# Patient Record
Sex: Female | Born: 1940
Health system: Southern US, Community
[De-identification: ages and names within clinical notes are randomized; demographics above are authoritative.]

## PROBLEM LIST (undated history)

## (undated) DIAGNOSIS — K219 Gastro-esophageal reflux disease without esophagitis: Secondary | ICD-10-CM

## (undated) DIAGNOSIS — G4730 Sleep apnea, unspecified: Secondary | ICD-10-CM

## (undated) DIAGNOSIS — F419 Anxiety disorder, unspecified: Secondary | ICD-10-CM

## (undated) DIAGNOSIS — F32A Depression, unspecified: Secondary | ICD-10-CM

---

## 1997-06-03 ENCOUNTER — Ambulatory Visit (HOSPITAL_COMMUNITY): Admission: RE | Admit: 1997-06-03 | Discharge: 1997-06-03 | Payer: Self-pay | Admitting: Obstetrics and Gynecology

## 1998-07-27 ENCOUNTER — Other Ambulatory Visit: Admission: RE | Admit: 1998-07-27 | Discharge: 1998-07-27 | Payer: Self-pay | Admitting: Obstetrics and Gynecology

## 1999-10-05 ENCOUNTER — Other Ambulatory Visit: Admission: RE | Admit: 1999-10-05 | Discharge: 1999-10-05 | Payer: Self-pay | Admitting: Obstetrics and Gynecology

## 2000-12-18 ENCOUNTER — Other Ambulatory Visit: Admission: RE | Admit: 2000-12-18 | Discharge: 2000-12-18 | Payer: Self-pay | Admitting: Obstetrics and Gynecology

## 2002-05-28 ENCOUNTER — Other Ambulatory Visit: Admission: RE | Admit: 2002-05-28 | Discharge: 2002-05-28 | Payer: Self-pay | Admitting: Obstetrics and Gynecology

## 2003-07-08 ENCOUNTER — Other Ambulatory Visit: Admission: RE | Admit: 2003-07-08 | Discharge: 2003-07-08 | Payer: Self-pay | Admitting: Obstetrics and Gynecology

## 2004-08-03 ENCOUNTER — Other Ambulatory Visit: Admission: RE | Admit: 2004-08-03 | Discharge: 2004-08-03 | Payer: Self-pay | Admitting: Obstetrics and Gynecology

## 2008-06-12 ENCOUNTER — Ambulatory Visit (HOSPITAL_COMMUNITY): Admission: RE | Admit: 2008-06-12 | Discharge: 2008-06-12 | Payer: Self-pay | Admitting: Geriatric Medicine

## 2010-02-07 ENCOUNTER — Encounter: Payer: Self-pay | Admitting: Geriatric Medicine

## 2011-01-18 DIAGNOSIS — F4323 Adjustment disorder with mixed anxiety and depressed mood: Secondary | ICD-10-CM | POA: Diagnosis not present

## 2011-02-12 ENCOUNTER — Ambulatory Visit (INDEPENDENT_AMBULATORY_CARE_PROVIDER_SITE_OTHER): Payer: Medicare Other

## 2011-02-12 DIAGNOSIS — J019 Acute sinusitis, unspecified: Secondary | ICD-10-CM | POA: Diagnosis not present

## 2011-02-12 DIAGNOSIS — Z23 Encounter for immunization: Secondary | ICD-10-CM | POA: Diagnosis not present

## 2011-05-08 DIAGNOSIS — Z0000 Encounter for general adult medical examination without abnormal findings: Secondary | ICD-10-CM | POA: Diagnosis not present

## 2011-05-08 DIAGNOSIS — Z1331 Encounter for screening for depression: Secondary | ICD-10-CM | POA: Diagnosis not present

## 2011-05-08 DIAGNOSIS — E7800 Pure hypercholesterolemia, unspecified: Secondary | ICD-10-CM | POA: Diagnosis not present

## 2011-05-08 DIAGNOSIS — H9190 Unspecified hearing loss, unspecified ear: Secondary | ICD-10-CM | POA: Diagnosis not present

## 2011-05-08 DIAGNOSIS — Z79899 Other long term (current) drug therapy: Secondary | ICD-10-CM | POA: Diagnosis not present

## 2011-06-01 DIAGNOSIS — H903 Sensorineural hearing loss, bilateral: Secondary | ICD-10-CM | POA: Diagnosis not present

## 2011-06-01 DIAGNOSIS — H905 Unspecified sensorineural hearing loss: Secondary | ICD-10-CM | POA: Diagnosis not present

## 2011-06-01 DIAGNOSIS — H9319 Tinnitus, unspecified ear: Secondary | ICD-10-CM | POA: Diagnosis not present

## 2011-06-08 DIAGNOSIS — L299 Pruritus, unspecified: Secondary | ICD-10-CM | POA: Diagnosis not present

## 2011-06-08 DIAGNOSIS — L570 Actinic keratosis: Secondary | ICD-10-CM | POA: Diagnosis not present

## 2011-08-16 DIAGNOSIS — Z124 Encounter for screening for malignant neoplasm of cervix: Secondary | ICD-10-CM | POA: Diagnosis not present

## 2011-08-16 DIAGNOSIS — Z1231 Encounter for screening mammogram for malignant neoplasm of breast: Secondary | ICD-10-CM | POA: Diagnosis not present

## 2011-10-18 DIAGNOSIS — M542 Cervicalgia: Secondary | ICD-10-CM | POA: Diagnosis not present

## 2011-10-18 DIAGNOSIS — M25549 Pain in joints of unspecified hand: Secondary | ICD-10-CM | POA: Diagnosis not present

## 2012-01-04 DIAGNOSIS — H2510 Age-related nuclear cataract, unspecified eye: Secondary | ICD-10-CM | POA: Diagnosis not present

## 2012-01-25 DIAGNOSIS — F4323 Adjustment disorder with mixed anxiety and depressed mood: Secondary | ICD-10-CM | POA: Diagnosis not present

## 2012-02-14 DIAGNOSIS — F4323 Adjustment disorder with mixed anxiety and depressed mood: Secondary | ICD-10-CM | POA: Diagnosis not present

## 2012-05-13 DIAGNOSIS — Z0000 Encounter for general adult medical examination without abnormal findings: Secondary | ICD-10-CM | POA: Diagnosis not present

## 2012-05-13 DIAGNOSIS — Z1331 Encounter for screening for depression: Secondary | ICD-10-CM | POA: Diagnosis not present

## 2012-05-13 DIAGNOSIS — E7800 Pure hypercholesterolemia, unspecified: Secondary | ICD-10-CM | POA: Diagnosis not present

## 2012-05-13 DIAGNOSIS — Z79899 Other long term (current) drug therapy: Secondary | ICD-10-CM | POA: Diagnosis not present

## 2012-05-14 DIAGNOSIS — E7800 Pure hypercholesterolemia, unspecified: Secondary | ICD-10-CM | POA: Diagnosis not present

## 2012-05-14 DIAGNOSIS — Z79899 Other long term (current) drug therapy: Secondary | ICD-10-CM | POA: Diagnosis not present

## 2012-07-24 DIAGNOSIS — F4323 Adjustment disorder with mixed anxiety and depressed mood: Secondary | ICD-10-CM | POA: Diagnosis not present

## 2012-08-16 DIAGNOSIS — F4323 Adjustment disorder with mixed anxiety and depressed mood: Secondary | ICD-10-CM | POA: Diagnosis not present

## 2012-10-08 DIAGNOSIS — F4323 Adjustment disorder with mixed anxiety and depressed mood: Secondary | ICD-10-CM | POA: Diagnosis not present

## 2012-12-02 DIAGNOSIS — F4323 Adjustment disorder with mixed anxiety and depressed mood: Secondary | ICD-10-CM | POA: Diagnosis not present

## 2013-01-02 DIAGNOSIS — H26019 Infantile and juvenile cortical, lamellar, or zonular cataract, unspecified eye: Secondary | ICD-10-CM | POA: Diagnosis not present

## 2013-01-02 DIAGNOSIS — H2510 Age-related nuclear cataract, unspecified eye: Secondary | ICD-10-CM | POA: Diagnosis not present

## 2013-01-27 DIAGNOSIS — F4323 Adjustment disorder with mixed anxiety and depressed mood: Secondary | ICD-10-CM | POA: Diagnosis not present

## 2013-03-10 DIAGNOSIS — F4323 Adjustment disorder with mixed anxiety and depressed mood: Secondary | ICD-10-CM | POA: Diagnosis not present

## 2013-03-14 DIAGNOSIS — Z01419 Encounter for gynecological examination (general) (routine) without abnormal findings: Secondary | ICD-10-CM | POA: Diagnosis not present

## 2013-03-14 DIAGNOSIS — M810 Age-related osteoporosis without current pathological fracture: Secondary | ICD-10-CM | POA: Diagnosis not present

## 2013-03-14 DIAGNOSIS — F329 Major depressive disorder, single episode, unspecified: Secondary | ICD-10-CM | POA: Diagnosis not present

## 2013-03-14 DIAGNOSIS — Z1231 Encounter for screening mammogram for malignant neoplasm of breast: Secondary | ICD-10-CM | POA: Diagnosis not present

## 2013-04-07 DIAGNOSIS — F4323 Adjustment disorder with mixed anxiety and depressed mood: Secondary | ICD-10-CM | POA: Diagnosis not present

## 2013-04-25 ENCOUNTER — Other Ambulatory Visit: Payer: Self-pay | Admitting: Geriatric Medicine

## 2013-04-25 DIAGNOSIS — R1013 Epigastric pain: Secondary | ICD-10-CM

## 2013-04-29 ENCOUNTER — Ambulatory Visit
Admission: RE | Admit: 2013-04-29 | Discharge: 2013-04-29 | Disposition: A | Discharge status: Home / Self Care | Payer: Medicare Other | Source: Ambulatory Visit | Attending: Geriatric Medicine | Admitting: Geriatric Medicine

## 2013-04-29 DIAGNOSIS — R1013 Epigastric pain: Secondary | ICD-10-CM

## 2013-04-29 DIAGNOSIS — K449 Diaphragmatic hernia without obstruction or gangrene: Secondary | ICD-10-CM | POA: Diagnosis not present

## 2013-04-29 DIAGNOSIS — K219 Gastro-esophageal reflux disease without esophagitis: Secondary | ICD-10-CM | POA: Diagnosis not present

## 2013-05-20 DIAGNOSIS — E7800 Pure hypercholesterolemia, unspecified: Secondary | ICD-10-CM | POA: Diagnosis not present

## 2013-05-20 DIAGNOSIS — Z1331 Encounter for screening for depression: Secondary | ICD-10-CM | POA: Diagnosis not present

## 2013-05-20 DIAGNOSIS — Z79899 Other long term (current) drug therapy: Secondary | ICD-10-CM | POA: Diagnosis not present

## 2013-05-20 DIAGNOSIS — Z0000 Encounter for general adult medical examination without abnormal findings: Secondary | ICD-10-CM | POA: Diagnosis not present

## 2013-05-20 DIAGNOSIS — R1013 Epigastric pain: Secondary | ICD-10-CM | POA: Diagnosis not present

## 2013-05-23 DIAGNOSIS — Z79899 Other long term (current) drug therapy: Secondary | ICD-10-CM | POA: Diagnosis not present

## 2013-05-23 DIAGNOSIS — E7800 Pure hypercholesterolemia, unspecified: Secondary | ICD-10-CM | POA: Diagnosis not present

## 2013-07-07 DIAGNOSIS — F4323 Adjustment disorder with mixed anxiety and depressed mood: Secondary | ICD-10-CM | POA: Diagnosis not present

## 2013-10-13 DIAGNOSIS — F4323 Adjustment disorder with mixed anxiety and depressed mood: Secondary | ICD-10-CM | POA: Diagnosis not present

## 2013-10-27 DIAGNOSIS — F411 Generalized anxiety disorder: Secondary | ICD-10-CM | POA: Diagnosis not present

## 2013-12-02 DIAGNOSIS — F411 Generalized anxiety disorder: Secondary | ICD-10-CM | POA: Diagnosis not present

## 2014-01-05 DIAGNOSIS — H25013 Cortical age-related cataract, bilateral: Secondary | ICD-10-CM | POA: Diagnosis not present

## 2014-01-05 DIAGNOSIS — H2513 Age-related nuclear cataract, bilateral: Secondary | ICD-10-CM | POA: Diagnosis not present

## 2014-01-26 DIAGNOSIS — F411 Generalized anxiety disorder: Secondary | ICD-10-CM | POA: Diagnosis not present

## 2014-03-20 DIAGNOSIS — N958 Other specified menopausal and perimenopausal disorders: Secondary | ICD-10-CM | POA: Diagnosis not present

## 2014-03-20 DIAGNOSIS — Z6824 Body mass index (BMI) 24.0-24.9, adult: Secondary | ICD-10-CM | POA: Diagnosis not present

## 2014-03-20 DIAGNOSIS — Z124 Encounter for screening for malignant neoplasm of cervix: Secondary | ICD-10-CM | POA: Diagnosis not present

## 2014-03-20 DIAGNOSIS — Z1231 Encounter for screening mammogram for malignant neoplasm of breast: Secondary | ICD-10-CM | POA: Diagnosis not present

## 2014-06-12 DIAGNOSIS — E780 Pure hypercholesterolemia: Secondary | ICD-10-CM | POA: Diagnosis not present

## 2014-06-12 DIAGNOSIS — Z0000 Encounter for general adult medical examination without abnormal findings: Secondary | ICD-10-CM | POA: Diagnosis not present

## 2014-06-12 DIAGNOSIS — M25512 Pain in left shoulder: Secondary | ICD-10-CM | POA: Diagnosis not present

## 2014-06-12 DIAGNOSIS — F325 Major depressive disorder, single episode, in full remission: Secondary | ICD-10-CM | POA: Diagnosis not present

## 2014-06-12 DIAGNOSIS — K219 Gastro-esophageal reflux disease without esophagitis: Secondary | ICD-10-CM | POA: Diagnosis not present

## 2014-06-12 DIAGNOSIS — Z79899 Other long term (current) drug therapy: Secondary | ICD-10-CM | POA: Diagnosis not present

## 2014-06-19 DIAGNOSIS — M542 Cervicalgia: Secondary | ICD-10-CM | POA: Diagnosis not present

## 2014-06-19 DIAGNOSIS — M25512 Pain in left shoulder: Secondary | ICD-10-CM | POA: Diagnosis not present

## 2014-06-22 DIAGNOSIS — Z1211 Encounter for screening for malignant neoplasm of colon: Secondary | ICD-10-CM | POA: Diagnosis not present

## 2014-08-24 DIAGNOSIS — F411 Generalized anxiety disorder: Secondary | ICD-10-CM | POA: Diagnosis not present

## 2014-09-23 DIAGNOSIS — F411 Generalized anxiety disorder: Secondary | ICD-10-CM | POA: Diagnosis not present

## 2014-10-19 DIAGNOSIS — F411 Generalized anxiety disorder: Secondary | ICD-10-CM | POA: Diagnosis not present

## 2015-01-21 DIAGNOSIS — H25013 Cortical age-related cataract, bilateral: Secondary | ICD-10-CM | POA: Diagnosis not present

## 2015-01-21 DIAGNOSIS — H2513 Age-related nuclear cataract, bilateral: Secondary | ICD-10-CM | POA: Diagnosis not present

## 2015-04-06 DIAGNOSIS — M25552 Pain in left hip: Secondary | ICD-10-CM | POA: Diagnosis not present

## 2015-04-13 DIAGNOSIS — M25552 Pain in left hip: Secondary | ICD-10-CM | POA: Diagnosis not present

## 2015-04-20 DIAGNOSIS — L821 Other seborrheic keratosis: Secondary | ICD-10-CM | POA: Diagnosis not present

## 2015-04-20 DIAGNOSIS — L299 Pruritus, unspecified: Secondary | ICD-10-CM | POA: Diagnosis not present

## 2015-04-20 DIAGNOSIS — D239 Other benign neoplasm of skin, unspecified: Secondary | ICD-10-CM | POA: Diagnosis not present

## 2015-04-20 DIAGNOSIS — M25552 Pain in left hip: Secondary | ICD-10-CM | POA: Diagnosis not present

## 2015-06-28 DIAGNOSIS — Z0000 Encounter for general adult medical examination without abnormal findings: Secondary | ICD-10-CM | POA: Diagnosis not present

## 2015-06-28 DIAGNOSIS — M19042 Primary osteoarthritis, left hand: Secondary | ICD-10-CM | POA: Diagnosis not present

## 2015-06-28 DIAGNOSIS — F325 Major depressive disorder, single episode, in full remission: Secondary | ICD-10-CM | POA: Diagnosis not present

## 2015-06-28 DIAGNOSIS — Z6825 Body mass index (BMI) 25.0-25.9, adult: Secondary | ICD-10-CM | POA: Diagnosis not present

## 2015-06-28 DIAGNOSIS — Z79899 Other long term (current) drug therapy: Secondary | ICD-10-CM | POA: Diagnosis not present

## 2015-06-28 DIAGNOSIS — M19041 Primary osteoarthritis, right hand: Secondary | ICD-10-CM | POA: Diagnosis not present

## 2015-06-28 DIAGNOSIS — E7800 Pure hypercholesterolemia, unspecified: Secondary | ICD-10-CM | POA: Diagnosis not present

## 2015-06-28 DIAGNOSIS — Z1389 Encounter for screening for other disorder: Secondary | ICD-10-CM | POA: Diagnosis not present

## 2015-07-13 DIAGNOSIS — F411 Generalized anxiety disorder: Secondary | ICD-10-CM | POA: Diagnosis not present

## 2015-10-18 DIAGNOSIS — F411 Generalized anxiety disorder: Secondary | ICD-10-CM | POA: Diagnosis not present

## 2015-10-21 DIAGNOSIS — H25013 Cortical age-related cataract, bilateral: Secondary | ICD-10-CM | POA: Diagnosis not present

## 2015-10-21 DIAGNOSIS — H2513 Age-related nuclear cataract, bilateral: Secondary | ICD-10-CM | POA: Diagnosis not present

## 2015-11-18 DIAGNOSIS — F411 Generalized anxiety disorder: Secondary | ICD-10-CM | POA: Diagnosis not present

## 2015-12-17 DIAGNOSIS — F411 Generalized anxiety disorder: Secondary | ICD-10-CM | POA: Diagnosis not present

## 2016-01-24 DIAGNOSIS — F411 Generalized anxiety disorder: Secondary | ICD-10-CM | POA: Diagnosis not present

## 2016-03-31 DIAGNOSIS — F411 Generalized anxiety disorder: Secondary | ICD-10-CM | POA: Diagnosis not present

## 2016-04-17 DIAGNOSIS — F411 Generalized anxiety disorder: Secondary | ICD-10-CM | POA: Diagnosis not present

## 2016-05-02 DIAGNOSIS — F411 Generalized anxiety disorder: Secondary | ICD-10-CM | POA: Diagnosis not present

## 2016-05-25 DIAGNOSIS — H2513 Age-related nuclear cataract, bilateral: Secondary | ICD-10-CM | POA: Diagnosis not present

## 2016-05-25 DIAGNOSIS — H25013 Cortical age-related cataract, bilateral: Secondary | ICD-10-CM | POA: Diagnosis not present

## 2016-05-31 DIAGNOSIS — H2513 Age-related nuclear cataract, bilateral: Secondary | ICD-10-CM | POA: Diagnosis not present

## 2016-05-31 DIAGNOSIS — H25013 Cortical age-related cataract, bilateral: Secondary | ICD-10-CM | POA: Diagnosis not present

## 2016-06-14 DIAGNOSIS — H25012 Cortical age-related cataract, left eye: Secondary | ICD-10-CM | POA: Diagnosis not present

## 2016-06-14 DIAGNOSIS — H2512 Age-related nuclear cataract, left eye: Secondary | ICD-10-CM | POA: Diagnosis not present

## 2016-06-19 DIAGNOSIS — F411 Generalized anxiety disorder: Secondary | ICD-10-CM | POA: Diagnosis not present

## 2016-06-22 DIAGNOSIS — H2511 Age-related nuclear cataract, right eye: Secondary | ICD-10-CM | POA: Diagnosis not present

## 2016-06-22 DIAGNOSIS — H25011 Cortical age-related cataract, right eye: Secondary | ICD-10-CM | POA: Diagnosis not present

## 2016-06-26 DIAGNOSIS — H2511 Age-related nuclear cataract, right eye: Secondary | ICD-10-CM | POA: Diagnosis not present

## 2016-06-26 DIAGNOSIS — H43811 Vitreous degeneration, right eye: Secondary | ICD-10-CM | POA: Diagnosis not present

## 2016-06-26 DIAGNOSIS — Z961 Presence of intraocular lens: Secondary | ICD-10-CM | POA: Diagnosis not present

## 2016-07-24 DIAGNOSIS — H2511 Age-related nuclear cataract, right eye: Secondary | ICD-10-CM | POA: Diagnosis not present

## 2016-07-24 DIAGNOSIS — H25011 Cortical age-related cataract, right eye: Secondary | ICD-10-CM | POA: Diagnosis not present

## 2016-08-21 DIAGNOSIS — F411 Generalized anxiety disorder: Secondary | ICD-10-CM | POA: Diagnosis not present

## 2016-08-30 DIAGNOSIS — H25011 Cortical age-related cataract, right eye: Secondary | ICD-10-CM | POA: Diagnosis not present

## 2016-08-30 DIAGNOSIS — H2511 Age-related nuclear cataract, right eye: Secondary | ICD-10-CM | POA: Diagnosis not present

## 2016-09-19 DIAGNOSIS — K219 Gastro-esophageal reflux disease without esophagitis: Secondary | ICD-10-CM | POA: Diagnosis not present

## 2016-09-19 DIAGNOSIS — Z79899 Other long term (current) drug therapy: Secondary | ICD-10-CM | POA: Diagnosis not present

## 2016-09-19 DIAGNOSIS — Z0000 Encounter for general adult medical examination without abnormal findings: Secondary | ICD-10-CM | POA: Diagnosis not present

## 2016-09-19 DIAGNOSIS — D649 Anemia, unspecified: Secondary | ICD-10-CM | POA: Diagnosis not present

## 2016-09-19 DIAGNOSIS — F325 Major depressive disorder, single episode, in full remission: Secondary | ICD-10-CM | POA: Diagnosis not present

## 2016-09-19 DIAGNOSIS — E7800 Pure hypercholesterolemia, unspecified: Secondary | ICD-10-CM | POA: Diagnosis not present

## 2016-09-19 DIAGNOSIS — Z1389 Encounter for screening for other disorder: Secondary | ICD-10-CM | POA: Diagnosis not present

## 2016-09-26 DIAGNOSIS — Z23 Encounter for immunization: Secondary | ICD-10-CM | POA: Diagnosis not present

## 2016-09-26 DIAGNOSIS — D649 Anemia, unspecified: Secondary | ICD-10-CM | POA: Diagnosis not present

## 2016-09-26 DIAGNOSIS — E611 Iron deficiency: Secondary | ICD-10-CM | POA: Diagnosis not present

## 2016-09-27 DIAGNOSIS — D500 Iron deficiency anemia secondary to blood loss (chronic): Secondary | ICD-10-CM | POA: Diagnosis not present

## 2016-10-05 DIAGNOSIS — B9681 Helicobacter pylori [H. pylori] as the cause of diseases classified elsewhere: Secondary | ICD-10-CM | POA: Diagnosis not present

## 2016-10-05 DIAGNOSIS — K5730 Diverticulosis of large intestine without perforation or abscess without bleeding: Secondary | ICD-10-CM | POA: Diagnosis not present

## 2016-10-05 DIAGNOSIS — D509 Iron deficiency anemia, unspecified: Secondary | ICD-10-CM | POA: Diagnosis not present

## 2016-10-05 DIAGNOSIS — K2950 Unspecified chronic gastritis without bleeding: Secondary | ICD-10-CM | POA: Diagnosis not present

## 2016-10-05 DIAGNOSIS — K2980 Duodenitis without bleeding: Secondary | ICD-10-CM | POA: Diagnosis not present

## 2016-10-05 DIAGNOSIS — K2930 Chronic superficial gastritis without bleeding: Secondary | ICD-10-CM | POA: Diagnosis not present

## 2016-10-05 DIAGNOSIS — K449 Diaphragmatic hernia without obstruction or gangrene: Secondary | ICD-10-CM | POA: Diagnosis not present

## 2016-10-05 DIAGNOSIS — D123 Benign neoplasm of transverse colon: Secondary | ICD-10-CM | POA: Diagnosis not present

## 2016-10-16 DIAGNOSIS — K2980 Duodenitis without bleeding: Secondary | ICD-10-CM | POA: Diagnosis not present

## 2016-10-16 DIAGNOSIS — K2950 Unspecified chronic gastritis without bleeding: Secondary | ICD-10-CM | POA: Diagnosis not present

## 2016-10-16 DIAGNOSIS — D123 Benign neoplasm of transverse colon: Secondary | ICD-10-CM | POA: Diagnosis not present

## 2016-10-16 DIAGNOSIS — B9681 Helicobacter pylori [H. pylori] as the cause of diseases classified elsewhere: Secondary | ICD-10-CM | POA: Diagnosis not present

## 2016-10-18 ENCOUNTER — Ambulatory Visit
Admission: RE | Admit: 2016-10-18 | Discharge: 2016-10-18 | Disposition: A | Discharge status: Home / Self Care | Payer: Medicare Other | Source: Ambulatory Visit | Attending: Geriatric Medicine | Admitting: Geriatric Medicine

## 2016-10-18 ENCOUNTER — Other Ambulatory Visit: Payer: Self-pay | Admitting: Geriatric Medicine

## 2016-10-18 DIAGNOSIS — M25541 Pain in joints of right hand: Secondary | ICD-10-CM | POA: Diagnosis not present

## 2016-10-18 DIAGNOSIS — M19042 Primary osteoarthritis, left hand: Secondary | ICD-10-CM | POA: Diagnosis not present

## 2016-10-18 DIAGNOSIS — D500 Iron deficiency anemia secondary to blood loss (chronic): Secondary | ICD-10-CM | POA: Diagnosis not present

## 2016-10-18 DIAGNOSIS — M25542 Pain in joints of left hand: Secondary | ICD-10-CM

## 2016-10-18 DIAGNOSIS — D126 Benign neoplasm of colon, unspecified: Secondary | ICD-10-CM | POA: Diagnosis not present

## 2016-10-18 DIAGNOSIS — K2970 Gastritis, unspecified, without bleeding: Secondary | ICD-10-CM | POA: Diagnosis not present

## 2016-10-18 DIAGNOSIS — M19041 Primary osteoarthritis, right hand: Secondary | ICD-10-CM | POA: Diagnosis not present

## 2016-10-18 DIAGNOSIS — B9681 Helicobacter pylori [H. pylori] as the cause of diseases classified elsewhere: Secondary | ICD-10-CM | POA: Diagnosis not present

## 2016-10-23 DIAGNOSIS — H26493 Other secondary cataract, bilateral: Secondary | ICD-10-CM | POA: Diagnosis not present

## 2016-10-23 DIAGNOSIS — Z961 Presence of intraocular lens: Secondary | ICD-10-CM | POA: Diagnosis not present

## 2016-11-30 DIAGNOSIS — F411 Generalized anxiety disorder: Secondary | ICD-10-CM | POA: Diagnosis not present

## 2016-12-12 DIAGNOSIS — B9681 Helicobacter pylori [H. pylori] as the cause of diseases classified elsewhere: Secondary | ICD-10-CM | POA: Diagnosis not present

## 2016-12-26 DIAGNOSIS — M150 Primary generalized (osteo)arthritis: Secondary | ICD-10-CM | POA: Diagnosis not present

## 2016-12-26 DIAGNOSIS — Z6824 Body mass index (BMI) 24.0-24.9, adult: Secondary | ICD-10-CM | POA: Diagnosis not present

## 2016-12-26 DIAGNOSIS — M2550 Pain in unspecified joint: Secondary | ICD-10-CM | POA: Diagnosis not present

## 2017-01-16 HISTORY — PX: BREAST EXCISIONAL BIOPSY: SUR124

## 2017-01-22 DIAGNOSIS — F411 Generalized anxiety disorder: Secondary | ICD-10-CM | POA: Diagnosis not present

## 2017-01-22 DIAGNOSIS — D649 Anemia, unspecified: Secondary | ICD-10-CM | POA: Diagnosis not present

## 2017-02-09 DIAGNOSIS — D500 Iron deficiency anemia secondary to blood loss (chronic): Secondary | ICD-10-CM | POA: Diagnosis not present

## 2017-02-14 ENCOUNTER — Other Ambulatory Visit: Payer: Self-pay | Admitting: Geriatric Medicine

## 2017-02-14 DIAGNOSIS — Z139 Encounter for screening, unspecified: Secondary | ICD-10-CM

## 2017-02-20 DIAGNOSIS — G4730 Sleep apnea, unspecified: Secondary | ICD-10-CM | POA: Diagnosis not present

## 2017-03-05 DIAGNOSIS — L259 Unspecified contact dermatitis, unspecified cause: Secondary | ICD-10-CM | POA: Diagnosis not present

## 2017-03-05 DIAGNOSIS — L570 Actinic keratosis: Secondary | ICD-10-CM | POA: Diagnosis not present

## 2017-03-08 DIAGNOSIS — H903 Sensorineural hearing loss, bilateral: Secondary | ICD-10-CM | POA: Diagnosis not present

## 2017-03-08 DIAGNOSIS — I499 Cardiac arrhythmia, unspecified: Secondary | ICD-10-CM | POA: Diagnosis not present

## 2017-03-15 ENCOUNTER — Ambulatory Visit
Admission: RE | Admit: 2017-03-15 | Discharge: 2017-03-15 | Disposition: A | Discharge status: Home / Self Care | Payer: Medicare Other | Source: Ambulatory Visit | Attending: Geriatric Medicine | Admitting: Geriatric Medicine

## 2017-03-15 DIAGNOSIS — Z139 Encounter for screening, unspecified: Secondary | ICD-10-CM

## 2017-03-15 DIAGNOSIS — G4733 Obstructive sleep apnea (adult) (pediatric): Secondary | ICD-10-CM | POA: Diagnosis not present

## 2017-03-15 DIAGNOSIS — Z1231 Encounter for screening mammogram for malignant neoplasm of breast: Secondary | ICD-10-CM | POA: Diagnosis not present

## 2017-03-22 DIAGNOSIS — Z6825 Body mass index (BMI) 25.0-25.9, adult: Secondary | ICD-10-CM | POA: Diagnosis not present

## 2017-03-22 DIAGNOSIS — M150 Primary generalized (osteo)arthritis: Secondary | ICD-10-CM | POA: Diagnosis not present

## 2017-03-22 DIAGNOSIS — E663 Overweight: Secondary | ICD-10-CM | POA: Diagnosis not present

## 2017-03-28 DIAGNOSIS — I499 Cardiac arrhythmia, unspecified: Secondary | ICD-10-CM | POA: Diagnosis not present

## 2017-04-03 DIAGNOSIS — I499 Cardiac arrhythmia, unspecified: Secondary | ICD-10-CM | POA: Diagnosis not present

## 2017-04-18 DIAGNOSIS — F411 Generalized anxiety disorder: Secondary | ICD-10-CM | POA: Diagnosis not present

## 2017-06-14 DIAGNOSIS — H00021 Hordeolum internum right upper eyelid: Secondary | ICD-10-CM | POA: Diagnosis not present

## 2017-07-24 DIAGNOSIS — F411 Generalized anxiety disorder: Secondary | ICD-10-CM | POA: Diagnosis not present

## 2017-08-02 DIAGNOSIS — Z886 Allergy status to analgesic agent status: Secondary | ICD-10-CM | POA: Diagnosis not present

## 2017-08-02 DIAGNOSIS — R002 Palpitations: Secondary | ICD-10-CM | POA: Diagnosis not present

## 2017-08-02 DIAGNOSIS — Z79899 Other long term (current) drug therapy: Secondary | ICD-10-CM | POA: Diagnosis not present

## 2017-08-02 DIAGNOSIS — Z791 Long term (current) use of non-steroidal anti-inflammatories (NSAID): Secondary | ICD-10-CM | POA: Diagnosis not present

## 2017-08-02 DIAGNOSIS — I493 Ventricular premature depolarization: Secondary | ICD-10-CM | POA: Diagnosis not present

## 2017-08-02 DIAGNOSIS — Z882 Allergy status to sulfonamides status: Secondary | ICD-10-CM | POA: Diagnosis not present

## 2017-08-10 DIAGNOSIS — R0602 Shortness of breath: Secondary | ICD-10-CM | POA: Diagnosis not present

## 2017-08-10 DIAGNOSIS — G4730 Sleep apnea, unspecified: Secondary | ICD-10-CM | POA: Diagnosis not present

## 2017-08-10 DIAGNOSIS — I471 Supraventricular tachycardia: Secondary | ICD-10-CM | POA: Diagnosis not present

## 2017-08-10 DIAGNOSIS — D500 Iron deficiency anemia secondary to blood loss (chronic): Secondary | ICD-10-CM | POA: Diagnosis not present

## 2017-08-20 DIAGNOSIS — F411 Generalized anxiety disorder: Secondary | ICD-10-CM | POA: Diagnosis not present

## 2017-09-21 DIAGNOSIS — I471 Supraventricular tachycardia: Secondary | ICD-10-CM | POA: Diagnosis not present

## 2017-09-21 DIAGNOSIS — Z23 Encounter for immunization: Secondary | ICD-10-CM | POA: Diagnosis not present

## 2017-09-21 DIAGNOSIS — E559 Vitamin D deficiency, unspecified: Secondary | ICD-10-CM | POA: Diagnosis not present

## 2017-09-21 DIAGNOSIS — F325 Major depressive disorder, single episode, in full remission: Secondary | ICD-10-CM | POA: Diagnosis not present

## 2017-09-21 DIAGNOSIS — D179 Benign lipomatous neoplasm, unspecified: Secondary | ICD-10-CM | POA: Diagnosis not present

## 2017-09-21 DIAGNOSIS — E7800 Pure hypercholesterolemia, unspecified: Secondary | ICD-10-CM | POA: Diagnosis not present

## 2017-09-21 DIAGNOSIS — K909 Intestinal malabsorption, unspecified: Secondary | ICD-10-CM | POA: Diagnosis not present

## 2017-09-21 DIAGNOSIS — Z0000 Encounter for general adult medical examination without abnormal findings: Secondary | ICD-10-CM | POA: Diagnosis not present

## 2017-09-21 DIAGNOSIS — Z79899 Other long term (current) drug therapy: Secondary | ICD-10-CM | POA: Diagnosis not present

## 2017-09-21 DIAGNOSIS — K219 Gastro-esophageal reflux disease without esophagitis: Secondary | ICD-10-CM | POA: Diagnosis not present

## 2017-09-21 DIAGNOSIS — Z1389 Encounter for screening for other disorder: Secondary | ICD-10-CM | POA: Diagnosis not present

## 2017-09-24 DIAGNOSIS — F411 Generalized anxiety disorder: Secondary | ICD-10-CM | POA: Diagnosis not present

## 2017-10-11 DIAGNOSIS — M150 Primary generalized (osteo)arthritis: Secondary | ICD-10-CM | POA: Diagnosis not present

## 2017-10-11 DIAGNOSIS — Z6825 Body mass index (BMI) 25.0-25.9, adult: Secondary | ICD-10-CM | POA: Diagnosis not present

## 2017-10-11 DIAGNOSIS — E663 Overweight: Secondary | ICD-10-CM | POA: Diagnosis not present

## 2017-10-31 DIAGNOSIS — H02883 Meibomian gland dysfunction of right eye, unspecified eyelid: Secondary | ICD-10-CM | POA: Diagnosis not present

## 2017-10-31 DIAGNOSIS — Z961 Presence of intraocular lens: Secondary | ICD-10-CM | POA: Diagnosis not present

## 2017-10-31 DIAGNOSIS — H02886 Meibomian gland dysfunction of left eye, unspecified eyelid: Secondary | ICD-10-CM | POA: Diagnosis not present

## 2017-11-05 DIAGNOSIS — F411 Generalized anxiety disorder: Secondary | ICD-10-CM | POA: Diagnosis not present

## 2017-11-22 ENCOUNTER — Encounter (INDEPENDENT_AMBULATORY_CARE_PROVIDER_SITE_OTHER): Payer: Self-pay | Admitting: Physician Assistant

## 2017-11-22 ENCOUNTER — Ambulatory Visit (INDEPENDENT_AMBULATORY_CARE_PROVIDER_SITE_OTHER): Payer: Medicare Other | Admitting: Physician Assistant

## 2017-11-22 ENCOUNTER — Ambulatory Visit (INDEPENDENT_AMBULATORY_CARE_PROVIDER_SITE_OTHER): Payer: Self-pay

## 2017-11-22 DIAGNOSIS — M25562 Pain in left knee: Secondary | ICD-10-CM

## 2017-11-22 DIAGNOSIS — M1712 Unilateral primary osteoarthritis, left knee: Secondary | ICD-10-CM | POA: Diagnosis not present

## 2017-11-22 MED ORDER — METHYLPREDNISOLONE ACETATE 40 MG/ML IJ SUSP
40.0000 mg | INTRAMUSCULAR | Status: AC | PRN
  Administered 2017-11-22: 40 mg via INTRA_ARTICULAR

## 2017-11-22 MED ORDER — LIDOCAINE HCL 1 % IJ SOLN
5.0000 mL | INTRAMUSCULAR | Status: AC | PRN
  Administered 2017-11-22: 5 mL

## 2017-11-22 NOTE — Progress Notes (Signed)
Office Visit Note   Patient: Emily Bates           Date of Birth: 1940-11-16           MRN: [id number] Visit Date: 11/22/2017              Requested by: Lajean Manes, MD [address] [place], [place] [address] PCP: Lajean Manes, MD  Chief Complaint  Patient presents with  . Left Knee - Pain      HPI: The patient is a 77 yo female who comes in today with complaints of left medial knee pain. She reports she fell directly onto her left knee about 1 month ago and had bruising, swelling and pain over the knee for several days. The pain resolved after several days and she was not having any issues with the left knee until Monday, 11/19/17 when she noticed pain over the medial left knee when she was gardening and going up and down a hill and walking on wet pine needles and uneven ground. She did not have any fall or no of any acute trauma or twisting on the knee specifically but just noticed the knee gradually becoming more painful.  She does take Meloxicam for her hand arthritis, but reports she can only take for a few days at a time due to GI upset. She has tried ice and heat as well as diclofenac gel to the knee but reports it only lasts for a short time.   Assessment & Plan: Visit Diagnoses:  1. Acute pain of left knee   2. Unilateral primary osteoarthritis, left knee     Plan: After informed consent the left knee was injected with lidocaine and Depo-medrol under sterile techniques and the patient tolerated this well. She will call back should her knee pain persist despite injection and will plan for MRI if symptoms not improved following injection. Otherwise, she will follow up in about 4 weeks.   Follow-Up Instructions: Return in about 4 weeks (around 12/20/2017).   Ortho Exam  Patient is alert, oriented, no adenopathy, well-dressed, normal affect, normal respiratory effort. Antalgic gait on the left, Mild tenderness to palpation over the left knee medial  joint line. Range of motion 0-110+ degrees. No instability. + [place]. Anterior and posterior drawers negative. No significant effusion. No signs of cellulitis or infection.   Imaging: Xr Knee 1-2 Views Left  Result Date: 11/22/2017 2 views left knee_ Medial joint space narrowing and subchondral sclerosis. Calcification of menicus, No fractures. No loose or foreign bodies.   No images are attached to the encounter.  Labs: No results found for: HGBA1C, ESRSEDRATE, CRP, LABURIC, REPTSTATUS, GRAMSTAIN, CULT, LABORGA   No results found for: ALBUMIN, PREALBUMIN, LABURIC  There is no height or weight on file to calculate BMI.  Orders:  Orders Placed This Encounter  Procedures  . XR Knee 1-2 Views Left   No orders of the defined types were placed in this encounter.    Procedures: Large Joint Inj: L knee on 11/22/2017 2:03 PM Indications: pain and diagnostic evaluation Details: 22 G 1.5 in needle, anteromedial approach  Arthrogram: No  Medications: 5 mL lidocaine 1 %; 40 mg methylPREDNISolone acetate 40 MG/ML Outcome: tolerated well, no immediate complications Procedure, treatment alternatives, risks and benefits explained, specific risks discussed. Consent was given by the patient. Immediately prior to procedure a time out was called to verify the correct patient, procedure, equipment, support staff and site/side marked as required. Patient was  prepped and draped in the usual sterile fashion.      Clinical Data: No additional findings.  ROS:  All other systems negative, except as noted in the HPI. Review of Systems  Objective: Vital Signs: There were no vitals taken for this visit.  Specialty Comments:  No specialty comments available.  PMFS History: There are no active problems to display for this patient.  History reviewed. No pertinent past medical history.  History reviewed. No pertinent family history.  History reviewed. No pertinent surgical history. Social  History   Occupational History  . Not on file  Tobacco Use  . Smoking status: Not on file  Substance and Sexual Activity  . Alcohol use: Not on file  . Drug use: Not on file  . Sexual activity: Not on file

## 2017-12-20 ENCOUNTER — Ambulatory Visit (INDEPENDENT_AMBULATORY_CARE_PROVIDER_SITE_OTHER): Payer: BLUE CROSS/BLUE SHIELD | Admitting: Physician Assistant

## 2018-01-29 DIAGNOSIS — Z23 Encounter for immunization: Secondary | ICD-10-CM | POA: Diagnosis not present

## 2018-02-18 DIAGNOSIS — D481 Neoplasm of uncertain behavior of connective and other soft tissue: Secondary | ICD-10-CM | POA: Diagnosis not present

## 2018-03-15 DIAGNOSIS — N6341 Unspecified lump in right breast, subareolar: Secondary | ICD-10-CM | POA: Diagnosis not present

## 2018-03-19 DIAGNOSIS — D171 Benign lipomatous neoplasm of skin and subcutaneous tissue of trunk: Secondary | ICD-10-CM | POA: Diagnosis not present

## 2018-03-19 DIAGNOSIS — D241 Benign neoplasm of right breast: Secondary | ICD-10-CM | POA: Diagnosis not present

## 2018-04-16 ENCOUNTER — Ambulatory Visit (INDEPENDENT_AMBULATORY_CARE_PROVIDER_SITE_OTHER): Payer: Medicare Other | Admitting: Family Medicine

## 2018-04-16 ENCOUNTER — Other Ambulatory Visit: Payer: Self-pay

## 2018-04-16 ENCOUNTER — Encounter (INDEPENDENT_AMBULATORY_CARE_PROVIDER_SITE_OTHER): Payer: Self-pay | Admitting: Family Medicine

## 2018-04-16 DIAGNOSIS — M545 Low back pain, unspecified: Secondary | ICD-10-CM

## 2018-04-16 MED ORDER — MELOXICAM 15 MG PO TABS: 7.5000 mg | ORAL_TABLET | Freq: Every day | ORAL | 6 refills | Status: DC | PRN

## 2018-04-16 MED ORDER — VITAMIN D-3 125 MCG (5000 UT) PO TABS: 1.0000 | ORAL_TABLET | Freq: Every day | ORAL | 3 refills | Status: DC

## 2018-04-16 MED ORDER — METHOCARBAMOL 500 MG PO TABS: 500.0000 mg | ORAL_TABLET | Freq: Four times a day (QID) | ORAL | 1 refills | Status: DC | PRN

## 2018-04-16 NOTE — Progress Notes (Signed)
   Office Visit Note   Patient: Emily Bates           Date of Birth: 05/19/1940           MRN: [id number] Visit Date: 04/16/2018 Requested by: Lajean Manes, MD [address] [place], [place] [address] PCP: Lajean Manes, MD  Subjective: Chief Complaint  Patient presents with  . Lower Back - Pain    "grabbing" low back pain since 04/12/2018 - had been gardening. No radiating pain down the legs.     HPI: She is here with low back pain.  Symptoms started about 4 or 5 days ago after doing gardening.  She did not notice pain while gardening but later on she was very sore.  The pain is gotten a little bit worse, no radicular symptoms.  It feels better to bend forward, worse to stand up straight.  She has had back problems years ago and was told she had some arthritis in her spine but she has done well since then.              ROS: Denies fevers, chills, night sweats.  No respiratory symptoms.  All other systems were reviewed and are negative.  Objective: Vital Signs: There were no vitals taken for this visit.  Physical Exam:  General:  Alert and oriented, in no acute distress. Pulm:  Breathing unlabored. Psy:  Normal mood, congruent affect. Skin: No rash on her skin. Low back: No tenderness over her thoracolumbar spinous processes.  No tenderness in the paraspinous muscles or the quadratus lumborum.  I cannot completely reproduce her pain by palpation today.  No tenderness at the SI joints or in the gluteus medius area.  Negative straight leg raise, lower extremity strength and reflexes are normal.  Imaging: None today.  Assessment & Plan: 1.  Midline low back pain, possibly due to facet arthropathy -Short-term use of meloxicam, Robaxin as needed.  Home stretching exercises.  X-rays if symptoms persist.     Procedures: No procedures performed  No notes on file     PMFS History: There are no active problems to display for this patient.  History  reviewed. No pertinent past medical history.  History reviewed. No pertinent family history.  History reviewed. No pertinent surgical history. Social History   Occupational History  . Not on file  Tobacco Use  . Smoking status: Never Smoker  . Smokeless tobacco: Never Used  Substance and Sexual Activity  . Alcohol use: Yes    Comment: rarely  . Drug use: Never  . Sexual activity: Not on file

## 2018-04-29 DIAGNOSIS — H9202 Otalgia, left ear: Secondary | ICD-10-CM | POA: Diagnosis not present

## 2018-07-05 ENCOUNTER — Other Ambulatory Visit: Payer: Self-pay | Admitting: Family Medicine

## 2018-07-05 MED ORDER — METHOCARBAMOL 500 MG PO TABS: 500.0000 mg | ORAL_TABLET | Freq: Four times a day (QID) | ORAL | 1 refills | Status: DC | PRN

## 2018-08-12 ENCOUNTER — Ambulatory Visit (INDEPENDENT_AMBULATORY_CARE_PROVIDER_SITE_OTHER): Payer: Medicare Other | Admitting: Family Medicine

## 2018-08-12 ENCOUNTER — Encounter: Payer: Self-pay | Admitting: Family Medicine

## 2018-08-12 ENCOUNTER — Ambulatory Visit (INDEPENDENT_AMBULATORY_CARE_PROVIDER_SITE_OTHER): Payer: Medicare Other

## 2018-08-12 DIAGNOSIS — M545 Low back pain, unspecified: Secondary | ICD-10-CM

## 2018-08-12 DIAGNOSIS — M546 Pain in thoracic spine: Secondary | ICD-10-CM

## 2018-08-12 DIAGNOSIS — S22000A Wedge compression fracture of unspecified thoracic vertebra, initial encounter for closed fracture: Secondary | ICD-10-CM

## 2018-08-12 MED ORDER — TIZANIDINE HCL 2 MG PO TABS: 2.0000 mg | ORAL_TABLET | Freq: Four times a day (QID) | ORAL | 1 refills | Status: AC | PRN

## 2018-08-12 MED ORDER — MELOXICAM 15 MG PO TABS: 7.5000 mg | ORAL_TABLET | Freq: Every day | ORAL | 6 refills | Status: DC | PRN

## 2018-08-12 NOTE — Progress Notes (Signed)
[name] - 78 y.o. female MRN [id number]  Date of birth: May 27, 1940  Office Visit Note: Visit Date: 08/12/2018 PCP: Lajean Manes, MD Referred by: Lajean Manes, MD  Subjective: Chief Complaint  Patient presents with  . Middle Back - Pain    Grabbing pain "all around my trunk" with any movements. Pain is not as severe as when she was here in March with her LBP.Left side worse than right. Home exercises not helping. Starting to get depressed over this.   HPI: Emily Bates is a 78 y.o. female who comes in today with pain in mid back.  Reports pain in mid back that was aggravated after intense gardening back in March. The pain improved 1-2 weeks after last appointment 3/31 with Meloxicam, Robaxin, and weekly massages. Was doing weekly massages for 5 weeks and pain was improved but now has been slowly worsening again. Pain is worse with bending, especially when she gardens. She has occasionally had numbness/tingling down left leg but reports that she has sciatica for years and this is no worse than usual.  Reports that she has pain mid back, especially when she gardens.   She was seen for pain in Otherwise per HPI.   Assessment & Plan: Visit Diagnoses:  1. Acute midline low back pain without sciatica   2. Pain in thoracic spine   3. Compression fracture of thoracic vertebra, initial encounter, unspecified thoracic vertebral level (HCC)     Plan:  Compression fractures seen on x ray- will obtain MRI to see if acute fracture or an older injury. Will refer to PT for massage given tight paraspinal muscles and spasms that seem to be source of pain. Pain medications as below.  Meds & Orders:  Meds ordered this encounter  Medications  . tiZANidine (ZANAFLEX) 2 MG tablet    Sig: Take 1-2 tablets (2-4 mg total) by mouth every 6 (six) hours as needed for muscle spasms.    Dispense:  60 tablet    Refill:  1  . meloxicam (MOBIC) 15 MG tablet    Sig: Take 0.5-1 tablets  (7.5-15 mg total) by mouth daily as needed for pain.    Dispense:  30 tablet    Refill:  6    Orders Placed This Encounter  Procedures  . XR Lumbar Spine 2-3 Views  . MR Thoracic Spine w/o contrast  . Ambulatory referral to Physical Therapy    Follow-up: Return if symptoms worsen or fail to improve.   Procedures: No procedures performed  No notes on file   Clinical History: No specialty comments available.   She reports that she has never smoked. She has never used smokeless tobacco. No results for input(s): HGBA1C, LABURIC in the last 8760 hours.  Objective:  VS:  HT:    WT:   BMI:     BP:   HR: bpm  TEMP: ( )  RESP:  Physical Exam  PHYSICAL EXAM: Gen: NAD, alert, cooperative with exam, well-appearing HEENT: clear conjunctiva,  CV:  no edema, capillary refill brisk, normal rate Resp: non-labored Skin: no rashes, normal turgor  Neuro: no gross deficits.  Psych:  alert and oriented  Ortho Exam  Spine: - Inspection: no gross deformity or asymmetry, swelling or ecchymosis - Palpation: No TTP over the spinous processes. TTP over paraspinal muscles in T8-T12. No TTP over SI joints b/l - ROM: full active ROM of the lumbar spine in flexion and extension without pain. Pain with left lateral bend.  -  Strength: 5/5 strength of lower extremity in L4-S1 nerve root distributions b/l; normal gait - Neuro: sensation intact in the L4-S1 nerve root distribution b/l, 2+ L4 and S1 reflexes - Special testing: Negative straight leg raise, negative Stork test, Negative FABER  Imaging: Xr Lumbar Spine 2-3 Views  Result Date: 08/12/2018 X-rays today show probable compression deformities of T8, T9, T11 and T12 and potentially L3.     Past Medical/Family/Surgical/Social History: Medications & Allergies reviewed per EMR, new medications updated. Rheumatoid arthritis There are no active problems to display for this patient.  History reviewed. No pertinent past medical history. History  reviewed. No pertinent family history. History reviewed. No pertinent surgical history. Social History   Occupational History  . Not on file  Tobacco Use  . Smoking status: Never Smoker  . Smokeless tobacco: Never Used  Substance and Sexual Activity  . Alcohol use: Yes    Comment: rarely  . Drug use: Never  . Sexual activity: Not on file

## 2018-08-12 NOTE — Progress Notes (Signed)
I saw and examined the patient with Dr. Mayer Masker and agree with assessment and plan as outlined.  Thoracolumbar back pain, improved but then got worse again.  Mostly midline pain.  It is affecting her ability to work in her garden.  X-rays today show probable compression deformities of T8, T9, T11 and T12 and potentially L3.  Not sure whether these are old or new.  She does have some bony tenderness over the lower thoracic spinous processes but most of her tenderness seems to be in the paraspinous muscles.  We will order MRI scan to make sure she does not have subacute compression fractures.  If negative, then myofascial release techniques and physical therapy at [facility].

## 2018-08-12 NOTE — Patient Instructions (Signed)
   [facility]

## 2018-08-15 DIAGNOSIS — M546 Pain in thoracic spine: Secondary | ICD-10-CM | POA: Diagnosis not present

## 2018-08-15 DIAGNOSIS — M545 Low back pain: Secondary | ICD-10-CM | POA: Diagnosis not present

## 2018-08-22 DIAGNOSIS — M545 Low back pain: Secondary | ICD-10-CM | POA: Diagnosis not present

## 2018-08-22 DIAGNOSIS — M546 Pain in thoracic spine: Secondary | ICD-10-CM | POA: Diagnosis not present

## 2018-09-17 ENCOUNTER — Ambulatory Visit
Admission: RE | Admit: 2018-09-17 | Discharge: 2018-09-17 | Disposition: A | Discharge status: Home / Self Care | Payer: Medicare Other | Source: Ambulatory Visit | Attending: Family Medicine | Admitting: Family Medicine

## 2018-09-17 ENCOUNTER — Other Ambulatory Visit: Payer: Self-pay

## 2018-09-17 DIAGNOSIS — R600 Localized edema: Secondary | ICD-10-CM | POA: Diagnosis not present

## 2018-09-17 DIAGNOSIS — M545 Low back pain, unspecified: Secondary | ICD-10-CM

## 2018-09-17 DIAGNOSIS — M546 Pain in thoracic spine: Secondary | ICD-10-CM

## 2018-09-18 ENCOUNTER — Telehealth: Payer: Self-pay | Admitting: Family Medicine

## 2018-09-18 NOTE — Telephone Encounter (Signed)
MRI shows three new compression fractures in her spine:  T8, T9 and T12.  There is an old healed one at T3.  The new ones are probably the cause of her mid-back pain.  In addition to her vitamin D, to strengthen her bones I recommend taking:  - Vitamin K2 at 100 mcg daily - Magnesium at 200-400 mg daily  Acid reflux drugs like protonix have been associated with development of osteoporosis.  Would recommend gradually weaning off this if able.  Can take Pepcid or Tagamet or Tums if needed, but most importantly she should avoid any food that tends to aggravate her stomach.  Could get her fitted for a back brace if needed for comfort, but it's not necessary for healing of these.  They usually heal in 8-12 weeks, but if severe pain persists, we can refer her for consultation for vertebroplasty (injection of "cement-type substance" into the bones).

## 2018-09-19 NOTE — Telephone Encounter (Signed)
Addendum to my earlier note. The patient said her back pain is better since her last office visit. She will let us know if she would like to try a back brace or be referred for consult on vertebroplasty, but for now she is ok.

## 2018-09-19 NOTE — Telephone Encounter (Signed)
I called the patient and gave her the MRI results and recommendations on vitamins and the acid reflux medication. I mailed a copy of these findings/instructions and the MRI report to the patient per request.

## 2018-10-02 ENCOUNTER — Other Ambulatory Visit: Payer: Self-pay | Admitting: Geriatric Medicine

## 2018-10-02 ENCOUNTER — Ambulatory Visit: Payer: Medicare Other | Admitting: Family Medicine

## 2018-10-02 ENCOUNTER — Ambulatory Visit (INDEPENDENT_AMBULATORY_CARE_PROVIDER_SITE_OTHER): Payer: Medicare Other | Admitting: Family Medicine

## 2018-10-02 ENCOUNTER — Encounter: Payer: Self-pay | Admitting: Family Medicine

## 2018-10-02 DIAGNOSIS — K9089 Other intestinal malabsorption: Secondary | ICD-10-CM | POA: Diagnosis not present

## 2018-10-02 DIAGNOSIS — S22000A Wedge compression fracture of unspecified thoracic vertebra, initial encounter for closed fracture: Secondary | ICD-10-CM | POA: Diagnosis not present

## 2018-10-02 DIAGNOSIS — E7800 Pure hypercholesterolemia, unspecified: Secondary | ICD-10-CM | POA: Diagnosis not present

## 2018-10-02 DIAGNOSIS — I471 Supraventricular tachycardia: Secondary | ICD-10-CM | POA: Diagnosis not present

## 2018-10-02 DIAGNOSIS — Z0000 Encounter for general adult medical examination without abnormal findings: Secondary | ICD-10-CM | POA: Diagnosis not present

## 2018-10-02 DIAGNOSIS — Z1231 Encounter for screening mammogram for malignant neoplasm of breast: Secondary | ICD-10-CM

## 2018-10-02 DIAGNOSIS — G4730 Sleep apnea, unspecified: Secondary | ICD-10-CM | POA: Diagnosis not present

## 2018-10-02 DIAGNOSIS — M65312 Trigger thumb, left thumb: Secondary | ICD-10-CM | POA: Diagnosis not present

## 2018-10-02 DIAGNOSIS — Z1389 Encounter for screening for other disorder: Secondary | ICD-10-CM | POA: Diagnosis not present

## 2018-10-02 DIAGNOSIS — K219 Gastro-esophageal reflux disease without esophagitis: Secondary | ICD-10-CM | POA: Diagnosis not present

## 2018-10-02 DIAGNOSIS — S32010D Wedge compression fracture of first lumbar vertebra, subsequent encounter for fracture with routine healing: Secondary | ICD-10-CM

## 2018-10-02 DIAGNOSIS — Z79899 Other long term (current) drug therapy: Secondary | ICD-10-CM | POA: Diagnosis not present

## 2018-10-02 DIAGNOSIS — Z23 Encounter for immunization: Secondary | ICD-10-CM | POA: Diagnosis not present

## 2018-10-02 NOTE — Progress Notes (Signed)
   Office Visit Note   Patient: Emily Bates           Date of Birth: [birth year]           MRN: [id number] Visit Date: 10/02/2018 Requested by: Lajean Manes, MD [address] [place],  [place] [address] PCP: Lajean Manes, MD  Subjective: Chief Complaint  Patient presents with  . Left Thumb - Pain    Trigger thumb - pain x 6 weeks - 2 months. Left-hand dominant. Meloxicam helps some.    HPI: She is here with left thumb pain.  Symptoms started about 6 weeks ago, no injury.  Her thumb has been triggering in flexion and is painful on the palmar aspect.  Meloxicam helps a little bit.  She is never had problems like this before.              ROS: No fevers or chills.  All other systems were reviewed and are negative.  Objective: Vital Signs: There were no vitals taken for this visit.  Physical Exam:  General:  Alert and oriented, in no acute distress. Pulm:  Breathing unlabored. Psy:  Normal mood, congruent affect. Skin: No rash or erythema. Left thumb: She has triggering at the A1 pulley but still full range of motion.  No joint effusion.  Imaging: None today.  Assessment & Plan: 1.  Left trigger thumb -We discussed various treatment options and elected to try dorsal splint application, ice to the palm of the hand, Voltaren gel for the next week or 2.  If symptoms persist, could try hand therapy or cortisone injection.        Procedures: No procedures performed  No notes on file     PMFS History: There are no active problems to display for this patient.  History reviewed. No pertinent past medical history.  History reviewed. No pertinent family history.  History reviewed. No pertinent surgical history. Social History   Occupational History  . Not on file  Tobacco Use  . Smoking status: Never Smoker  . Smokeless tobacco: Never Used  Substance and Sexual Activity  . Alcohol use: Yes    Comment: rarely  . Drug use: Never  . Sexual  activity: Not on file

## 2018-10-10 DIAGNOSIS — Z6825 Body mass index (BMI) 25.0-25.9, adult: Secondary | ICD-10-CM | POA: Diagnosis not present

## 2018-10-10 DIAGNOSIS — E663 Overweight: Secondary | ICD-10-CM | POA: Diagnosis not present

## 2018-10-10 DIAGNOSIS — M150 Primary generalized (osteo)arthritis: Secondary | ICD-10-CM | POA: Diagnosis not present

## 2018-10-22 ENCOUNTER — Encounter: Payer: Self-pay | Admitting: Family Medicine

## 2018-10-22 ENCOUNTER — Ambulatory Visit (INDEPENDENT_AMBULATORY_CARE_PROVIDER_SITE_OTHER): Payer: Medicare Other | Admitting: Family Medicine

## 2018-10-22 ENCOUNTER — Telehealth: Payer: Self-pay | Admitting: Family Medicine

## 2018-10-22 DIAGNOSIS — M5431 Sciatica, right side: Secondary | ICD-10-CM

## 2018-10-22 DIAGNOSIS — M79645 Pain in left finger(s): Secondary | ICD-10-CM | POA: Diagnosis not present

## 2018-10-22 DIAGNOSIS — M65312 Trigger thumb, left thumb: Secondary | ICD-10-CM | POA: Diagnosis not present

## 2018-10-22 DIAGNOSIS — M799 Soft tissue disorder, unspecified: Secondary | ICD-10-CM | POA: Diagnosis not present

## 2018-10-22 DIAGNOSIS — M19049 Primary osteoarthritis, unspecified hand: Secondary | ICD-10-CM | POA: Diagnosis not present

## 2018-10-22 MED ORDER — DEXAMETHASONE SODIUM PHOSPHATE 4 MG/ML IJ SOLN: INTRAMUSCULAR | 6 refills | Status: DC

## 2018-10-22 NOTE — Progress Notes (Signed)
Emily Bates - 78 y.o. female MRN [id number]  Date of birth: 23-Jun-1940  Office Visit Note: Visit Date: 10/22/2018 PCP: Lajean Manes, MD Referred by: Lajean Manes, MD  Subjective: Chief Complaint  Patient presents with  . Left Thumb - Pain, Follow-up    No better with splinting and icing.  . Lower Back - Pain    Chronic issue that is worsening. Pain is usually just in right buttock. Worsens with driving. Now the pain started radiating down the leg to the foot. Starting to have some "vague sort of numbness."   HPI: Emily Bates is a 78 y.o. female who comes in today for lower back/buttocks pain as well as follow up for left thumb.  Left thumb- she has been wearing dorsal splint on thumb and have been icing palm with minimal improvement. Thumb is fine when in brace but when she removes brace she is still having triggering of thumb. She is requesting ionophoresis. She is left handed and is having difficulty pruning with her left hand due to thumb.  Lower back- she has noticed pain in right buttocks with sciatica down right leg for the past several years. She mainly feels it when she is driving in the car for > 45 minutes. Pain used to stop at mid thigh but is now extending down to foot. No weakness, change in bowel or bladder function. No numbness noted.    ROS Otherwise per HPI.  Assessment & Plan: Visit Diagnoses:  1. Trigger thumb, left thumb   2. Sciatica of right side     Plan:  - will refer to physical therapy for ionophoresis for trigger thumb as well as treatment for sciatica - if no improvement after treatments, will consider steroid injection   Meds & Orders: No orders of the defined types were placed in this encounter.  No orders of the defined types were placed in this encounter.   Follow-up: PRN  Procedures: No procedures performed  No notes on file   Clinical History: No specialty comments available.   She reports that she has never  smoked. She has never used smokeless tobacco. No results for input(s): HGBA1C, LABURIC in the last 8760 hours.  Objective:  VS:  HT:    WT:   BMI:     BP:   HR: bpm  TEMP: ( )  RESP:  Physical Exam  PHYSICAL EXAM: Gen: NAD, alert, cooperative with exam, well-appearing HEENT: clear conjunctiva,  CV:  no edema, capillary refill brisk, normal rate Resp: non-labored Skin: no rashes, normal turgor  Neuro: no gross deficits.  Psych:  alert and oriented  Ortho Exam  Lumbar spine: - Inspection: no gross deformity or asymmetry, swelling or ecchymosis - Palpation: No TTP over the spinous processes, paraspinal muscles, or SI joints b/l - ROM: full active ROM of the lumbar spine in flexion and extension without pain - Strength: 5/5 strength of lower extremity in L4-S1 nerve root distributions b/l; normal gait - Neuro: sensation intact in the L4-S1 nerve root distribution b/l, 2+ L4 and S1 reflexes - Special testing: Negative straight leg raise, negative Stork test  Left thumb: triggering at A1 pulley. Full ROM with active and passive motion. No effusion noted. Swelling noted at thenar eminence.    Imaging: No results found.  Past Medical/Family/Surgical/Social History: Medications & Allergies reviewed per EMR, new medications updated. There are no active problems to display for this patient.  No past medical history on file. No family history on file.  No past surgical history on file. Social History   Occupational History  . Not on file  Tobacco Use  . Smoking status: Never Smoker  . Smokeless tobacco: Never Used  Substance and Sexual Activity  . Alcohol use: Yes    Comment: rarely  . Drug use: Never  . Sexual activity: Not on file

## 2018-10-22 NOTE — Telephone Encounter (Signed)
Last 2 ov notes faxed to [facility] [phone]

## 2018-10-22 NOTE — Addendum Note (Signed)
Addended by: Hortencia Pilar on: 10/22/2018 05:23 PM   Modules accepted: Orders

## 2018-10-22 NOTE — Progress Notes (Signed)
I saw and examined the patient with Dr. Mayer Masker and agree with assessment and plan as outlined.    Right sided sciatica, mainly when sitting/driving.  Neuro exam non-focal.  Will try PT.  Continued left trigger thumb.  Will try ionto in PT.  Inject if not better.

## 2018-10-23 DIAGNOSIS — M25551 Pain in right hip: Secondary | ICD-10-CM | POA: Diagnosis not present

## 2018-10-23 DIAGNOSIS — R262 Difficulty in walking, not elsewhere classified: Secondary | ICD-10-CM | POA: Diagnosis not present

## 2018-10-23 DIAGNOSIS — M5416 Radiculopathy, lumbar region: Secondary | ICD-10-CM | POA: Diagnosis not present

## 2018-10-23 DIAGNOSIS — M6281 Muscle weakness (generalized): Secondary | ICD-10-CM | POA: Diagnosis not present

## 2018-10-24 DIAGNOSIS — M19049 Primary osteoarthritis, unspecified hand: Secondary | ICD-10-CM | POA: Diagnosis not present

## 2018-10-24 DIAGNOSIS — M65312 Trigger thumb, left thumb: Secondary | ICD-10-CM | POA: Diagnosis not present

## 2018-10-24 DIAGNOSIS — M79645 Pain in left finger(s): Secondary | ICD-10-CM | POA: Diagnosis not present

## 2018-10-24 DIAGNOSIS — M799 Soft tissue disorder, unspecified: Secondary | ICD-10-CM | POA: Diagnosis not present

## 2018-10-28 DIAGNOSIS — M6281 Muscle weakness (generalized): Secondary | ICD-10-CM | POA: Diagnosis not present

## 2018-10-28 DIAGNOSIS — R262 Difficulty in walking, not elsewhere classified: Secondary | ICD-10-CM | POA: Diagnosis not present

## 2018-10-28 DIAGNOSIS — M25551 Pain in right hip: Secondary | ICD-10-CM | POA: Diagnosis not present

## 2018-10-28 DIAGNOSIS — M5416 Radiculopathy, lumbar region: Secondary | ICD-10-CM | POA: Diagnosis not present

## 2018-10-29 DIAGNOSIS — M799 Soft tissue disorder, unspecified: Secondary | ICD-10-CM | POA: Diagnosis not present

## 2018-10-29 DIAGNOSIS — M79645 Pain in left finger(s): Secondary | ICD-10-CM | POA: Diagnosis not present

## 2018-10-29 DIAGNOSIS — M19049 Primary osteoarthritis, unspecified hand: Secondary | ICD-10-CM | POA: Diagnosis not present

## 2018-10-29 DIAGNOSIS — M65312 Trigger thumb, left thumb: Secondary | ICD-10-CM | POA: Diagnosis not present

## 2018-10-30 DIAGNOSIS — M25551 Pain in right hip: Secondary | ICD-10-CM | POA: Diagnosis not present

## 2018-10-30 DIAGNOSIS — R262 Difficulty in walking, not elsewhere classified: Secondary | ICD-10-CM | POA: Diagnosis not present

## 2018-10-30 DIAGNOSIS — M6281 Muscle weakness (generalized): Secondary | ICD-10-CM | POA: Diagnosis not present

## 2018-10-30 DIAGNOSIS — M5416 Radiculopathy, lumbar region: Secondary | ICD-10-CM | POA: Diagnosis not present

## 2018-10-31 DIAGNOSIS — M65312 Trigger thumb, left thumb: Secondary | ICD-10-CM | POA: Diagnosis not present

## 2018-10-31 DIAGNOSIS — M799 Soft tissue disorder, unspecified: Secondary | ICD-10-CM | POA: Diagnosis not present

## 2018-10-31 DIAGNOSIS — M19049 Primary osteoarthritis, unspecified hand: Secondary | ICD-10-CM | POA: Diagnosis not present

## 2018-10-31 DIAGNOSIS — M79645 Pain in left finger(s): Secondary | ICD-10-CM | POA: Diagnosis not present

## 2018-11-04 DIAGNOSIS — M5416 Radiculopathy, lumbar region: Secondary | ICD-10-CM | POA: Diagnosis not present

## 2018-11-04 DIAGNOSIS — M6281 Muscle weakness (generalized): Secondary | ICD-10-CM | POA: Diagnosis not present

## 2018-11-04 DIAGNOSIS — M25551 Pain in right hip: Secondary | ICD-10-CM | POA: Diagnosis not present

## 2018-11-04 DIAGNOSIS — R262 Difficulty in walking, not elsewhere classified: Secondary | ICD-10-CM | POA: Diagnosis not present

## 2018-11-05 DIAGNOSIS — M79645 Pain in left finger(s): Secondary | ICD-10-CM | POA: Diagnosis not present

## 2018-11-05 DIAGNOSIS — M19049 Primary osteoarthritis, unspecified hand: Secondary | ICD-10-CM | POA: Diagnosis not present

## 2018-11-05 DIAGNOSIS — M799 Soft tissue disorder, unspecified: Secondary | ICD-10-CM | POA: Diagnosis not present

## 2018-11-05 DIAGNOSIS — M65312 Trigger thumb, left thumb: Secondary | ICD-10-CM | POA: Diagnosis not present

## 2018-11-06 DIAGNOSIS — M6281 Muscle weakness (generalized): Secondary | ICD-10-CM | POA: Diagnosis not present

## 2018-11-06 DIAGNOSIS — M25551 Pain in right hip: Secondary | ICD-10-CM | POA: Diagnosis not present

## 2018-11-06 DIAGNOSIS — M5416 Radiculopathy, lumbar region: Secondary | ICD-10-CM | POA: Diagnosis not present

## 2018-11-06 DIAGNOSIS — R262 Difficulty in walking, not elsewhere classified: Secondary | ICD-10-CM | POA: Diagnosis not present

## 2018-11-07 DIAGNOSIS — M799 Soft tissue disorder, unspecified: Secondary | ICD-10-CM | POA: Diagnosis not present

## 2018-11-07 DIAGNOSIS — M79645 Pain in left finger(s): Secondary | ICD-10-CM | POA: Diagnosis not present

## 2018-11-07 DIAGNOSIS — M65312 Trigger thumb, left thumb: Secondary | ICD-10-CM | POA: Diagnosis not present

## 2018-11-07 DIAGNOSIS — M19049 Primary osteoarthritis, unspecified hand: Secondary | ICD-10-CM | POA: Diagnosis not present

## 2018-11-11 DIAGNOSIS — M6281 Muscle weakness (generalized): Secondary | ICD-10-CM | POA: Diagnosis not present

## 2018-11-11 DIAGNOSIS — R262 Difficulty in walking, not elsewhere classified: Secondary | ICD-10-CM | POA: Diagnosis not present

## 2018-11-11 DIAGNOSIS — M5416 Radiculopathy, lumbar region: Secondary | ICD-10-CM | POA: Diagnosis not present

## 2018-11-11 DIAGNOSIS — M25551 Pain in right hip: Secondary | ICD-10-CM | POA: Diagnosis not present

## 2018-11-12 DIAGNOSIS — M79645 Pain in left finger(s): Secondary | ICD-10-CM | POA: Diagnosis not present

## 2018-11-12 DIAGNOSIS — M65312 Trigger thumb, left thumb: Secondary | ICD-10-CM | POA: Diagnosis not present

## 2018-11-12 DIAGNOSIS — M19049 Primary osteoarthritis, unspecified hand: Secondary | ICD-10-CM | POA: Diagnosis not present

## 2018-11-12 DIAGNOSIS — M799 Soft tissue disorder, unspecified: Secondary | ICD-10-CM | POA: Diagnosis not present

## 2018-11-13 DIAGNOSIS — M5416 Radiculopathy, lumbar region: Secondary | ICD-10-CM | POA: Diagnosis not present

## 2018-11-13 DIAGNOSIS — R262 Difficulty in walking, not elsewhere classified: Secondary | ICD-10-CM | POA: Diagnosis not present

## 2018-11-13 DIAGNOSIS — M6281 Muscle weakness (generalized): Secondary | ICD-10-CM | POA: Diagnosis not present

## 2018-11-13 DIAGNOSIS — M25551 Pain in right hip: Secondary | ICD-10-CM | POA: Diagnosis not present

## 2018-11-14 DIAGNOSIS — M65312 Trigger thumb, left thumb: Secondary | ICD-10-CM | POA: Diagnosis not present

## 2018-11-14 DIAGNOSIS — M799 Soft tissue disorder, unspecified: Secondary | ICD-10-CM | POA: Diagnosis not present

## 2018-11-14 DIAGNOSIS — M19049 Primary osteoarthritis, unspecified hand: Secondary | ICD-10-CM | POA: Diagnosis not present

## 2018-11-14 DIAGNOSIS — M79645 Pain in left finger(s): Secondary | ICD-10-CM | POA: Diagnosis not present

## 2018-11-20 DIAGNOSIS — M6281 Muscle weakness (generalized): Secondary | ICD-10-CM | POA: Diagnosis not present

## 2018-11-20 DIAGNOSIS — R262 Difficulty in walking, not elsewhere classified: Secondary | ICD-10-CM | POA: Diagnosis not present

## 2018-11-20 DIAGNOSIS — M5416 Radiculopathy, lumbar region: Secondary | ICD-10-CM | POA: Diagnosis not present

## 2018-11-20 DIAGNOSIS — M25551 Pain in right hip: Secondary | ICD-10-CM | POA: Diagnosis not present

## 2018-11-21 DIAGNOSIS — M5416 Radiculopathy, lumbar region: Secondary | ICD-10-CM | POA: Diagnosis not present

## 2018-11-21 DIAGNOSIS — M25551 Pain in right hip: Secondary | ICD-10-CM | POA: Diagnosis not present

## 2018-11-21 DIAGNOSIS — M6281 Muscle weakness (generalized): Secondary | ICD-10-CM | POA: Diagnosis not present

## 2018-11-21 DIAGNOSIS — R262 Difficulty in walking, not elsewhere classified: Secondary | ICD-10-CM | POA: Diagnosis not present

## 2018-11-25 DIAGNOSIS — M25551 Pain in right hip: Secondary | ICD-10-CM | POA: Diagnosis not present

## 2018-11-25 DIAGNOSIS — M5416 Radiculopathy, lumbar region: Secondary | ICD-10-CM | POA: Diagnosis not present

## 2018-11-25 DIAGNOSIS — M6281 Muscle weakness (generalized): Secondary | ICD-10-CM | POA: Diagnosis not present

## 2018-11-25 DIAGNOSIS — R262 Difficulty in walking, not elsewhere classified: Secondary | ICD-10-CM | POA: Diagnosis not present

## 2018-11-27 DIAGNOSIS — M6281 Muscle weakness (generalized): Secondary | ICD-10-CM | POA: Diagnosis not present

## 2018-11-27 DIAGNOSIS — R262 Difficulty in walking, not elsewhere classified: Secondary | ICD-10-CM | POA: Diagnosis not present

## 2018-11-27 DIAGNOSIS — M5416 Radiculopathy, lumbar region: Secondary | ICD-10-CM | POA: Diagnosis not present

## 2018-11-27 DIAGNOSIS — M25551 Pain in right hip: Secondary | ICD-10-CM | POA: Diagnosis not present

## 2018-12-02 DIAGNOSIS — M5416 Radiculopathy, lumbar region: Secondary | ICD-10-CM | POA: Diagnosis not present

## 2018-12-02 DIAGNOSIS — M6281 Muscle weakness (generalized): Secondary | ICD-10-CM | POA: Diagnosis not present

## 2018-12-02 DIAGNOSIS — M25551 Pain in right hip: Secondary | ICD-10-CM | POA: Diagnosis not present

## 2018-12-02 DIAGNOSIS — R262 Difficulty in walking, not elsewhere classified: Secondary | ICD-10-CM | POA: Diagnosis not present

## 2018-12-04 DIAGNOSIS — M5416 Radiculopathy, lumbar region: Secondary | ICD-10-CM | POA: Diagnosis not present

## 2018-12-04 DIAGNOSIS — M6281 Muscle weakness (generalized): Secondary | ICD-10-CM | POA: Diagnosis not present

## 2018-12-04 DIAGNOSIS — M25551 Pain in right hip: Secondary | ICD-10-CM | POA: Diagnosis not present

## 2018-12-04 DIAGNOSIS — R262 Difficulty in walking, not elsewhere classified: Secondary | ICD-10-CM | POA: Diagnosis not present

## 2018-12-09 DIAGNOSIS — M6281 Muscle weakness (generalized): Secondary | ICD-10-CM | POA: Diagnosis not present

## 2018-12-09 DIAGNOSIS — M25551 Pain in right hip: Secondary | ICD-10-CM | POA: Diagnosis not present

## 2018-12-09 DIAGNOSIS — M5416 Radiculopathy, lumbar region: Secondary | ICD-10-CM | POA: Diagnosis not present

## 2018-12-09 DIAGNOSIS — R262 Difficulty in walking, not elsewhere classified: Secondary | ICD-10-CM | POA: Diagnosis not present

## 2018-12-10 ENCOUNTER — Other Ambulatory Visit: Payer: Medicare Other

## 2018-12-10 ENCOUNTER — Ambulatory Visit: Payer: Medicare Other

## 2018-12-11 DIAGNOSIS — R262 Difficulty in walking, not elsewhere classified: Secondary | ICD-10-CM | POA: Diagnosis not present

## 2018-12-11 DIAGNOSIS — M25551 Pain in right hip: Secondary | ICD-10-CM | POA: Diagnosis not present

## 2018-12-11 DIAGNOSIS — M6281 Muscle weakness (generalized): Secondary | ICD-10-CM | POA: Diagnosis not present

## 2018-12-11 DIAGNOSIS — M5416 Radiculopathy, lumbar region: Secondary | ICD-10-CM | POA: Diagnosis not present

## 2018-12-16 DIAGNOSIS — M25551 Pain in right hip: Secondary | ICD-10-CM | POA: Diagnosis not present

## 2018-12-16 DIAGNOSIS — M5416 Radiculopathy, lumbar region: Secondary | ICD-10-CM | POA: Diagnosis not present

## 2018-12-16 DIAGNOSIS — R262 Difficulty in walking, not elsewhere classified: Secondary | ICD-10-CM | POA: Diagnosis not present

## 2018-12-16 DIAGNOSIS — M6281 Muscle weakness (generalized): Secondary | ICD-10-CM | POA: Diagnosis not present

## 2018-12-23 DIAGNOSIS — M25551 Pain in right hip: Secondary | ICD-10-CM | POA: Diagnosis not present

## 2018-12-23 DIAGNOSIS — R262 Difficulty in walking, not elsewhere classified: Secondary | ICD-10-CM | POA: Diagnosis not present

## 2018-12-23 DIAGNOSIS — M5416 Radiculopathy, lumbar region: Secondary | ICD-10-CM | POA: Diagnosis not present

## 2018-12-23 DIAGNOSIS — M6281 Muscle weakness (generalized): Secondary | ICD-10-CM | POA: Diagnosis not present

## 2018-12-25 ENCOUNTER — Other Ambulatory Visit: Payer: Self-pay

## 2018-12-25 ENCOUNTER — Ambulatory Visit
Admission: RE | Admit: 2018-12-25 | Discharge: 2018-12-25 | Disposition: A | Discharge status: Home / Self Care | Payer: Medicare Other | Source: Ambulatory Visit | Attending: Geriatric Medicine | Admitting: Geriatric Medicine

## 2018-12-25 DIAGNOSIS — Z1231 Encounter for screening mammogram for malignant neoplasm of breast: Secondary | ICD-10-CM | POA: Diagnosis not present

## 2018-12-25 DIAGNOSIS — Z780 Asymptomatic menopausal state: Secondary | ICD-10-CM | POA: Diagnosis not present

## 2018-12-25 DIAGNOSIS — M810 Age-related osteoporosis without current pathological fracture: Secondary | ICD-10-CM | POA: Diagnosis not present

## 2018-12-25 DIAGNOSIS — M8588 Other specified disorders of bone density and structure, other site: Secondary | ICD-10-CM | POA: Diagnosis not present

## 2018-12-25 DIAGNOSIS — S32010D Wedge compression fracture of first lumbar vertebra, subsequent encounter for fracture with routine healing: Secondary | ICD-10-CM

## 2019-02-09 ENCOUNTER — Ambulatory Visit: Payer: Medicare Other | Attending: Internal Medicine

## 2019-02-09 DIAGNOSIS — Z23 Encounter for immunization: Secondary | ICD-10-CM | POA: Insufficient documentation

## 2019-02-10 NOTE — Progress Notes (Signed)
   Covid-19 Vaccination Clinic  Name:  FRIEDA FRICANO    MRN: [id number] DOB: 28-Jun-1940  02/09/2019  Ms. Hemp was observed post Covid-19 immunization for 15 minutes without incidence. She was provided with Vaccine Information Sheet and instruction to access the V-Safe system.   Ms. Demoulin was instructed to call 911 with any severe reactions post vaccine: Marland Kitchen Difficulty breathing  . Swelling of your face and throat  . A fast heartbeat  . A bad rash all over your body  . Dizziness and weakness    Immunizations Administered    Name Date Dose VIS Date Route   Moderna COVID-19 Vaccine 02/09/2019  5:21 PM 0.5 mL 12/17/2018 Intramuscular   Manufacturer: Levan Hurst   Lot: [id number]   [id number]      Documented on behalf of:  C. Jerline Pain

## 2019-02-10 NOTE — Progress Notes (Deleted)
   Covid-19 Vaccination Clinic  Name:  Emily Bates    MRN: [id number] DOB: April 10, 1940  02/09/2019  Emily Bates was observed post Covid-19 immunization for {COVID Vaccine Observation Times:23551} without incidence. She was provided with Vaccine Information Sheet and instruction to access the V-Safe system.   Emily Bates was instructed to call 911 with any severe reactions post vaccine: Marland Kitchen Difficulty breathing  . Swelling of your face and throat  . A fast heartbeat  . A bad rash all over your body  . Dizziness and weakness      Documented on behalf of:  {KKVACCINE:23624}

## 2019-03-09 ENCOUNTER — Ambulatory Visit: Payer: Medicare Other | Attending: Internal Medicine

## 2019-03-09 DIAGNOSIS — Z23 Encounter for immunization: Secondary | ICD-10-CM

## 2019-03-09 NOTE — Progress Notes (Signed)
   Covid-19 Vaccination Clinic  Name:  Emily Bates    MRN: [id number] DOB: [birth year]  03/09/2019  [name] was observed post Covid-19 immunization for 15 minutes without incidence. She was provided with Vaccine Information Sheet and instruction to access the V-Safe system.   [name] was instructed to call 911 with any severe reactions post vaccine: Marland Kitchen Difficulty breathing  . Swelling of your face and throat  . A fast heartbeat  . A bad rash all over your body  . Dizziness and weakness    Immunizations Administered    Name Date Dose VIS Date Route   Moderna COVID-19 Vaccine 03/09/2019  3:49 PM 0.5 mL 12/17/2018 Intramuscular   Manufacturer: Moderna   Lot: [id number]   [id number]

## 2019-04-02 DIAGNOSIS — Z8719 Personal history of other diseases of the digestive system: Secondary | ICD-10-CM | POA: Diagnosis not present

## 2019-04-02 DIAGNOSIS — K257 Chronic gastric ulcer without hemorrhage or perforation: Secondary | ICD-10-CM | POA: Diagnosis not present

## 2019-04-02 DIAGNOSIS — D509 Iron deficiency anemia, unspecified: Secondary | ICD-10-CM | POA: Diagnosis not present

## 2019-05-14 ENCOUNTER — Telehealth: Payer: Self-pay | Admitting: Family Medicine

## 2019-05-14 NOTE — Telephone Encounter (Signed)
Patient called.   She is requesting a call back about the pain she is experiencing in the area below her shoulder. She is also wanting to know if she could switch into the care of another provider in order to be seen sooner.    Call back: [phone]

## 2019-05-14 NOTE — Telephone Encounter (Signed)
I called the patient. For a week now, she has been experiencing pain in the right side of her back, under the right scapula. It hurts for her to twist her upper body. In the past, meloxicam would help the pain but it is not working for this pain. Methacarbamol helps, but the pain is still there. She is now on Fosamax, calcium and vitamin D for her bones. She held off on getting a back brace for comfort - is interested in that now. She did want to come in for evaluation and possibly new xrays, though, since this pain is a bit different and she has h/o thoracic compression fractures. Will work her in tomorrow morning with Dr. Junius Roads at 8:20.

## 2019-05-15 ENCOUNTER — Telehealth: Payer: Self-pay | Admitting: Family Medicine

## 2019-05-15 ENCOUNTER — Encounter: Payer: Self-pay | Admitting: Family Medicine

## 2019-05-15 ENCOUNTER — Other Ambulatory Visit: Payer: Self-pay

## 2019-05-15 ENCOUNTER — Ambulatory Visit (INDEPENDENT_AMBULATORY_CARE_PROVIDER_SITE_OTHER): Payer: Medicare Other

## 2019-05-15 ENCOUNTER — Ambulatory Visit (INDEPENDENT_AMBULATORY_CARE_PROVIDER_SITE_OTHER): Payer: Medicare Other | Admitting: Family Medicine

## 2019-05-15 DIAGNOSIS — M546 Pain in thoracic spine: Secondary | ICD-10-CM

## 2019-05-15 DIAGNOSIS — M25551 Pain in right hip: Secondary | ICD-10-CM | POA: Diagnosis not present

## 2019-05-15 NOTE — Telephone Encounter (Signed)
Emily Bates from [employer] needs a immediate call back from Dr. Junius Roads office. Emily Bates need what type brace for this patient. Sending as high priority message. Emily Bates needs more information about what type fracture and what type brace. Emily Bates from [employer] number is [phone].

## 2019-05-15 NOTE — Progress Notes (Signed)
   Office Visit Note   Patient: Emily Bates           Date of Birth: 1940/07/20           MRN: [id number] Visit Date: 05/15/2019 Requested by: Lajean Manes, MD [address] [place],  [place] [address] PCP: Lajean Manes, MD  Subjective: Chief Complaint  Patient presents with  . Middle Back - Pain    Pain right middle back, just below the scapula. Hurts with any twisting motion of the back. H/o thoracic and lumbar compression fractures.    HPI: She is here with right upper back pain.  A few days ago she was shoveling in her garden.  She did not feel any pain that day, but the next day she had pain near the scapula and it has continued to bother her.  The pain feels similar to when she had thoracic compression fractures last year.  She still having some right posterior lateral hip pain which is worse when driving and better when moving around.  No significant radicular symptoms anymore.  Physical therapy helped, but she is still having some trouble.              ROS: No bowel or bladder dysfunction.  All other systems were reviewed and are negative.  Objective: Vital Signs: There were no vitals taken for this visit.  Physical Exam:  General:  Alert and oriented, in no acute distress. Pulm:  Breathing unlabored. Psy:  Normal mood, congruent affect. Skin: No rash or bruising. Thoracic spine: She has a tender trigger point in the right rhomboid area that seems to reproduce her pain.  Palpation along the thoracic spinous processes does not cause any pain today. Right hip: She is tender on the posterior aspect of the greater trochanter and in the sciatic notch.  No significant pain with internal hip rotation.  Imaging: XR Thoracic Spine 2 View  Result Date: 05/15/2019 X-rays thoracic spine compared to MRI scan from 2020 reveal unchanged appearance of multiple compression fractures.  I do not see any new compression fracture.   Assessment & Plan: 1.  Right  upper back pain, suspect myofascial pain -Deep tissue massage using a tennis ball.  Physical therapy referral to [facility].  Could inject with dextrose if symptoms persist. -Referral to biotech for a brace for her chronic intermittent back pain.  2.  Chronic right posterior lateral hip pain -Resume physical therapy.  Lumbar MRI scan if she fails to improve.       Procedures: No procedures performed  No notes on file     PMFS History: There are no problems to display for this patient.  History reviewed. No pertinent past medical history.  History reviewed. No pertinent family history.  Past Surgical History:  Procedure Laterality Date  . BREAST EXCISIONAL BIOPSY Right 2019   Social History   Occupational History  . Not on file  Tobacco Use  . Smoking status: Never Smoker  . Smokeless tobacco: Never Used  Substance and Sexual Activity  . Alcohol use: Yes    Comment: rarely  . Drug use: Never  . Sexual activity: Not on file

## 2019-10-01 ENCOUNTER — Ambulatory Visit (INDEPENDENT_AMBULATORY_CARE_PROVIDER_SITE_OTHER): Payer: Medicare Other | Admitting: Orthopedic Surgery

## 2019-10-01 ENCOUNTER — Encounter: Payer: Self-pay | Admitting: Orthopedic Surgery

## 2019-10-01 DIAGNOSIS — M25551 Pain in right hip: Secondary | ICD-10-CM | POA: Diagnosis not present

## 2019-10-01 NOTE — Progress Notes (Signed)
Office Visit Note   Patient: Emily Bates           Date of Birth: 07/13/1940           MRN: [id number] Visit Date: 10/01/2019 Requested by: Lajean Manes, MD [address] [place],  [place] [address] PCP: Lajean Manes, MD  Subjective: Chief Complaint  Patient presents with  . Right Hip - Pain    HPI: Zeba is a 79 year old patient with right hip pain.  Reports pain just posterior to the trochanteric region as well as radiating some in the back.  Denies any groin pain.  Sitting increases her pain.  Walking is actually okay.  Nighttime is actually okay.  If she sits for 30 minutes and tries to get up she has "bad pain".  She has to stop and get out to walk around after sitting about 30 minutes in the car.  The pain will radiate down the side of her leg below the knee at times.  Rubbing actually helps her symptoms.  She is sleeping on her stomach and does not really sleep on the side.  Denies any groin pain.  She has a recent history of compression fractures of her lumbar spine by radiographic analysis.  Has had physical therapy in the past for this by Leroy Sea over at [facility] physical therapy which has helped.              ROS: All systems reviewed are negative as they relate to the chief complaint within the history of present illness.  Patient denies  fevers or chills.   Assessment & Plan: Visit Diagnoses:  1. Pain in right hip     Plan: Impression is right hip pain which may be gluteal tendinitis versus trochanteric bursitis which is less likely versus referred pain from the back.  Patient wants to avoid radiographs at this time.  I would prefer to get some back x-rays but she wants to hold off on that.  For now we will start with physical therapy with [facility] and 6-week return with plain radiographs and further work-up at that time.  Does not look like hip arthritis based on exam today.  Follow-Up Instructions: No follow-ups on file.   Orders:  No orders of  the defined types were placed in this encounter.  No orders of the defined types were placed in this encounter.     Procedures: No procedures performed   Clinical Data: No additional findings.  Objective: Vital Signs: There were no vitals taken for this visit.  Physical Exam:   Constitutional: Patient appears well-developed HEENT:  Head: Normocephalic Eyes:EOM are normal Neck: Normal range of motion Cardiovascular: Normal rate Pulmonary/chest: Effort normal Neurologic: Patient is alert Skin: Skin is warm Psychiatric: Patient has normal mood and affect    Ortho Exam: Ortho exam demonstrates normal gait alignment.  No groin pain with internal extra rotation of the leg on the right-hand side.  No masses lymphadenopathy or skin changes noted in the back or hip region.  No discrete trochanteric tenderness but she does have a little bit of mild posterior tenderness on the right-hand side no nerve root tension signs.  Patient has 5 out of 5 ankle dorsiflexion plantarflexion quad and hamstring strength.  No paresthesias L1 S1 bilaterally  Specialty Comments:  No specialty comments available.  Imaging: No results found.   PMFS History: There are no problems to display for this patient.  History reviewed. No pertinent past medical history.  History  reviewed. No pertinent family history.  Past Surgical History:  Procedure Laterality Date  . BREAST EXCISIONAL BIOPSY Right 2019   Social History   Occupational History  . Not on file  Tobacco Use  . Smoking status: Never Smoker  . Smokeless tobacco: Never Used  Substance and Sexual Activity  . Alcohol use: Yes    Comment: rarely  . Drug use: Never  . Sexual activity: Not on file

## 2019-10-07 DIAGNOSIS — F325 Major depressive disorder, single episode, in full remission: Secondary | ICD-10-CM | POA: Diagnosis not present

## 2019-10-07 DIAGNOSIS — Z23 Encounter for immunization: Secondary | ICD-10-CM | POA: Diagnosis not present

## 2019-10-07 DIAGNOSIS — Z0000 Encounter for general adult medical examination without abnormal findings: Secondary | ICD-10-CM | POA: Diagnosis not present

## 2019-10-07 DIAGNOSIS — I471 Supraventricular tachycardia: Secondary | ICD-10-CM | POA: Diagnosis not present

## 2019-10-07 DIAGNOSIS — E7800 Pure hypercholesterolemia, unspecified: Secondary | ICD-10-CM | POA: Diagnosis not present

## 2019-10-07 DIAGNOSIS — Z79899 Other long term (current) drug therapy: Secondary | ICD-10-CM | POA: Diagnosis not present

## 2019-10-07 DIAGNOSIS — K219 Gastro-esophageal reflux disease without esophagitis: Secondary | ICD-10-CM | POA: Diagnosis not present

## 2019-10-07 DIAGNOSIS — G4730 Sleep apnea, unspecified: Secondary | ICD-10-CM | POA: Diagnosis not present

## 2019-10-07 DIAGNOSIS — K257 Chronic gastric ulcer without hemorrhage or perforation: Secondary | ICD-10-CM | POA: Diagnosis not present

## 2019-10-07 DIAGNOSIS — D509 Iron deficiency anemia, unspecified: Secondary | ICD-10-CM | POA: Diagnosis not present

## 2019-10-07 DIAGNOSIS — Z1389 Encounter for screening for other disorder: Secondary | ICD-10-CM | POA: Diagnosis not present

## 2019-10-09 ENCOUNTER — Telehealth: Payer: Self-pay

## 2019-10-09 ENCOUNTER — Telehealth: Payer: Self-pay | Admitting: Orthopedic Surgery

## 2019-10-09 ENCOUNTER — Other Ambulatory Visit: Payer: Self-pay

## 2019-10-09 DIAGNOSIS — G8929 Other chronic pain: Secondary | ICD-10-CM

## 2019-10-09 DIAGNOSIS — M25551 Pain in right hip: Secondary | ICD-10-CM

## 2019-10-09 NOTE — Telephone Encounter (Signed)
Can you please ask him about this? Needs rx filled out for [facility] for patient and then fax to [facility] (patient already has an appointment scheduled) also ask him if he is ok with [place] getting an MRI scan?

## 2019-10-09 NOTE — Telephone Encounter (Signed)
IC LMVM for patient to call me back to discuss her request for MRI and PT. Need clarification. Was under the impression Dr Marlou Sa provided patient with script to [facility]. Also put in referral for [facility] PT but patient wanting [facility]. Also patient asking for MRI scan. Need to further discuss this with Dr Marlou Sa once hear back from patient as she has not had any plain film xrays and likely insurance will deny without xrays and lack of conservative treatment for 6 weeks with PT

## 2019-10-09 NOTE — Telephone Encounter (Signed)
Patient would like MRI of her lower back and stated that she will need a Rx for [facility].  Has an appointment scheduled on 10/21/2019.  Cb# [phone].  Please advise.  Thank you

## 2019-10-09 NOTE — Telephone Encounter (Signed)
Referral placed and sent to [facility] to call patient about scheduling.

## 2019-10-09 NOTE — Progress Notes (Signed)
Can you please call patient to schedule? I put in order for this.

## 2019-10-09 NOTE — Telephone Encounter (Signed)
Patient called.   She was following up on her referral to PT. I didn't see a referral in her chart but she said she was told by Dr.Dean that there would be one placed.   Call back: [phone]

## 2019-10-10 NOTE — Telephone Encounter (Signed)
rx faxed to [facility].  MRI order entered. I left voicemail for patient advising.

## 2019-10-10 NOTE — Addendum Note (Signed)
Addended by: Meyer Cory on: 10/10/2019 01:42 PM   Modules accepted: Orders

## 2019-10-10 NOTE — Telephone Encounter (Signed)
Per Dr. Marlou Sa, ok for MRI, however, insurance may not authorize. PT rx completed.

## 2019-10-10 NOTE — Telephone Encounter (Signed)
Duplicate message in chart. I left voicemail for patient that MRI may not be approved, however, Dr. Marlou Sa is going to order and see. PT rx was faxed.

## 2019-10-13 DIAGNOSIS — Z6825 Body mass index (BMI) 25.0-25.9, adult: Secondary | ICD-10-CM | POA: Diagnosis not present

## 2019-10-13 DIAGNOSIS — E663 Overweight: Secondary | ICD-10-CM | POA: Diagnosis not present

## 2019-10-13 DIAGNOSIS — M150 Primary generalized (osteo)arthritis: Secondary | ICD-10-CM | POA: Diagnosis not present

## 2019-10-14 DIAGNOSIS — M546 Pain in thoracic spine: Secondary | ICD-10-CM | POA: Diagnosis not present

## 2019-10-14 DIAGNOSIS — M25551 Pain in right hip: Secondary | ICD-10-CM | POA: Diagnosis not present

## 2019-10-14 DIAGNOSIS — M545 Low back pain: Secondary | ICD-10-CM | POA: Diagnosis not present

## 2019-10-16 DIAGNOSIS — M545 Low back pain: Secondary | ICD-10-CM | POA: Diagnosis not present

## 2019-10-16 DIAGNOSIS — M25551 Pain in right hip: Secondary | ICD-10-CM | POA: Diagnosis not present

## 2019-10-16 DIAGNOSIS — M546 Pain in thoracic spine: Secondary | ICD-10-CM | POA: Diagnosis not present

## 2019-10-19 ENCOUNTER — Encounter (HOSPITAL_COMMUNITY): Payer: Self-pay | Admitting: *Deleted

## 2019-10-19 ENCOUNTER — Emergency Department (HOSPITAL_COMMUNITY)
Admission: EM | Admit: 2019-10-19 | Discharge: 2019-10-19 | Disposition: A | Discharge status: Home / Self Care | Payer: Medicare Other | Attending: Emergency Medicine | Admitting: Emergency Medicine

## 2019-10-19 ENCOUNTER — Emergency Department (HOSPITAL_COMMUNITY): Payer: Medicare Other

## 2019-10-19 ENCOUNTER — Other Ambulatory Visit: Payer: Self-pay

## 2019-10-19 DIAGNOSIS — R197 Diarrhea, unspecified: Secondary | ICD-10-CM

## 2019-10-19 DIAGNOSIS — K5790 Diverticulosis of intestine, part unspecified, without perforation or abscess without bleeding: Secondary | ICD-10-CM

## 2019-10-19 DIAGNOSIS — R109 Unspecified abdominal pain: Secondary | ICD-10-CM | POA: Diagnosis not present

## 2019-10-19 DIAGNOSIS — K5730 Diverticulosis of large intestine without perforation or abscess without bleeding: Secondary | ICD-10-CM | POA: Insufficient documentation

## 2019-10-19 DIAGNOSIS — N83299 Other ovarian cyst, unspecified side: Secondary | ICD-10-CM | POA: Diagnosis not present

## 2019-10-19 DIAGNOSIS — K449 Diaphragmatic hernia without obstruction or gangrene: Secondary | ICD-10-CM | POA: Insufficient documentation

## 2019-10-19 DIAGNOSIS — N949 Unspecified condition associated with female genital organs and menstrual cycle: Secondary | ICD-10-CM

## 2019-10-19 DIAGNOSIS — N83292 Other ovarian cyst, left side: Secondary | ICD-10-CM | POA: Insufficient documentation

## 2019-10-19 DIAGNOSIS — R9431 Abnormal electrocardiogram [ECG] [EKG]: Secondary | ICD-10-CM | POA: Diagnosis not present

## 2019-10-19 DIAGNOSIS — R195 Other fecal abnormalities: Secondary | ICD-10-CM

## 2019-10-19 LAB — CBC WITH DIFFERENTIAL/PLATELET
Abs Immature Granulocytes: 0.05 10*3/uL (ref 0.00–0.07)
Basophils Absolute: 0 10*3/uL (ref 0.0–0.1)
Basophils Relative: 0 %
Eosinophils Absolute: 0 10*3/uL (ref 0.0–0.5)
Eosinophils Relative: 0 %
HCT: 38.6 % (ref 36.0–46.0)
Hemoglobin: 13 g/dL (ref 12.0–15.0)
Immature Granulocytes: 1 %
Lymphocytes Relative: 10 %
Lymphs Abs: 0.8 10*3/uL (ref 0.7–4.0)
MCH: 30.3 pg (ref 26.0–34.0)
MCHC: 33.7 g/dL (ref 30.0–36.0)
MCV: 90 fL (ref 80.0–100.0)
Monocytes Absolute: 0.4 10*3/uL (ref 0.1–1.0)
Monocytes Relative: 4 %
Neutro Abs: 7.1 10*3/uL (ref 1.7–7.7)
Neutrophils Relative %: 85 %
Platelets: 211 10*3/uL (ref 150–400)
RBC: 4.29 MIL/uL (ref 3.87–5.11)
RDW: 12.4 % (ref 11.5–15.5)
WBC: 8.4 10*3/uL (ref 4.0–10.5)
nRBC: 0 % (ref 0.0–0.2)

## 2019-10-19 LAB — COMPREHENSIVE METABOLIC PANEL
ALT: 21 U/L (ref 0–44)
AST: 29 U/L (ref 15–41)
Albumin: 4.3 g/dL (ref 3.5–5.0)
Alkaline Phosphatase: 60 U/L (ref 38–126)
Anion gap: 10 (ref 5–15)
BUN: 32 mg/dL — ABNORMAL HIGH (ref 8–23)
CO2: 25 mmol/L (ref 22–32)
Calcium: 9.4 mg/dL (ref 8.9–10.3)
Chloride: 105 mmol/L (ref 98–111)
Creatinine, Ser: 0.66 mg/dL (ref 0.44–1.00)
GFR calc Af Amer: >60 mL/min (ref >60)
GFR calc non Af Amer: >60 mL/min (ref >60)
Glucose, Bld: 123 mg/dL — ABNORMAL HIGH (ref 70–99)
Potassium: 4.2 mmol/L (ref 3.5–5.1)
Sodium: 140 mmol/L (ref 135–145)
Total Bilirubin: 1 mg/dL (ref 0.3–1.2)
Total Protein: 7 g/dL (ref 6.5–8.1)

## 2019-10-19 LAB — TYPE AND SCREEN
ABO/RH(D): O NEG
Antibody Screen: NEGATIVE

## 2019-10-19 LAB — ABO/RH: ABO/RH(D): O NEG

## 2019-10-19 LAB — POC OCCULT BLOOD, ED: Fecal Occult Bld: POSITIVE — AB

## 2019-10-19 LAB — PROTIME-INR
INR: 1 (ref 0.8–1.2)
Prothrombin Time: 13.2 seconds (ref 11.4–15.2)

## 2019-10-19 MED ORDER — SODIUM CHLORIDE (PF) 0.9 % IJ SOLN
INTRAMUSCULAR | Status: AC
  Filled 2019-10-19: qty 50

## 2019-10-19 MED ORDER — LACTATED RINGERS IV BOLUS
500.0000 mL | Freq: Once | INTRAVENOUS | Status: AC
  Administered 2019-10-19: 500 mL via INTRAVENOUS

## 2019-10-19 MED ORDER — LACTATED RINGERS IV SOLN: INTRAVENOUS | Status: AC

## 2019-10-19 MED ORDER — IOHEXOL 300 MG/ML  SOLN
100.0000 mL | Freq: Once | INTRAMUSCULAR | Status: AC | PRN
  Administered 2019-10-19: 100 mL via INTRAVENOUS

## 2019-10-19 NOTE — Discharge Instructions (Addendum)
1. Large gastric hernia.  2. Colonic diverticulosis.  3. 6.6 cm x 5.6 cm left adnexal cyst, likely ovarian in origin.  Correlation with pelvic ultrasound is recommended.  4. Chronic compression fracture deformity at the level of T12.  5. Aortic atherosclerosis.   Please take your protonix twice a day for the next week.  Please avoid any NSAIDS or alcohol as these can worsen gastric irritation.

## 2019-10-19 NOTE — ED Provider Notes (Signed)
[facility] DEPT Provider Note   CSN: [id number] Arrival date & time: 10/19/19  1133     History Chief Complaint  Patient presents with  . GI Bleeding    DASHONDA BONNEAU is a 79 y.o. female with a past medical history of GI bleeding from stomach ulcers who presents today for evaluation of change in stool color and diarrhea.  She reports that about a week ago she noted a change in her stool color and that it was darker.  She describes it as coffee ground.  She states that her stomach started feeling upset overnight and that since this morning she has had 5 episodes of watery diarrhea.  She notes that for the past 2 days she has been camping with family.  The water that they have been drinking they brought from home, she has been eating foods out of a cooler with ice however no one else is sick and a change in stool color started before hand.  She reports cramping abdominal pain with nausea without vomiting prior to the onset of her diarrhea that alleviates after she had diarrhea.  She denies any history of hemorrhoids.  No bright red blood.  She denies any dysuria increased frequency or urgency.  No recent fevers.  She has had both of her Covid vaccines.  She does not take any NSAIDs.  She denies any recent antibiotics or known sick contacts.  She denies eating any new foods such as beets that would change the color of her stool.  No Pepto use. HPI     History reviewed. No pertinent past medical history.  There are no problems to display for this patient.   Past Surgical History:  Procedure Laterality Date  . BREAST EXCISIONAL BIOPSY Right 2019     OB History   No obstetric history on file.     No family history on file.  Social History   Tobacco Use  . Smoking status: Never Smoker  . Smokeless tobacco: Never Used  Substance Use Topics  . Alcohol use: Yes    Comment: rarely  . Drug use: Never    Home Medications Prior to Admission  medications   Medication Sig Start Date End Date Taking? Authorizing Provider  acetaminophen (TYLENOL) 500 MG tablet Take 1,000 mg by mouth every 8 (eight) hours as needed for mild pain or headache.    Yes [provider]  alendronate (FOSAMAX) 70 MG tablet Take 70 mg by mouth once a week. Sunday 04/24/19  Yes [provider]  calcium carbonate (OS-CAL) 1250 (500 Ca) MG chewable tablet Chew 2 tablets by mouth daily.    Yes [provider]  Cholecalciferol (VITAMIN D-3) 125 MCG (5000 UT) TABS Take 1 tablet by mouth daily. 04/16/18  Yes Hilts, Legrand Como, MD  diclofenac sodium (VOLTAREN) 1 % GEL Apply 2 g topically 2 (two) times daily as needed (joint pain).    Yes [provider]  meloxicam (MOBIC) 15 MG tablet Take 0.5-1 tablets (7.5-15 mg total) by mouth daily as needed for pain. 08/12/18  Yes Hilts, Legrand Como, MD  methocarbamol (ROBAXIN) 500 MG tablet Take 1 tablet (500 mg total) by mouth every 6 (six) hours as needed for muscle spasms. 07/05/18  Yes Hilts, Legrand Como, MD  Multiple Vitamin (MULTIVITAMIN) capsule Take 1 capsule by mouth daily.    Yes [provider]  pantoprazole (PROTONIX) 40 MG tablet Take 40 mg by mouth daily.  03/19/18  Yes [provider]  RESTASIS 0.05 %  ophthalmic emulsion Place 1 drop into both eyes 2 (two) times daily.  01/08/18  Yes [provider]  sertraline (ZOLOFT) 100 MG tablet Take 100 mg by mouth daily.  03/19/18  Yes [provider]  simvastatin (ZOCOR) 10 MG tablet Take 10 mg by mouth daily at 6 PM.  03/19/18  Yes [provider]  zolpidem (AMBIEN) 5 MG tablet Take 2.5 mg by mouth at bedtime as needed for sleep.    Yes [provider]  dexamethasone (DECADRON) 4 MG/ML injection Apply as needed for iontophoresis Patient not taking: Reported on 10/19/2019 10/22/18   Hilts, Legrand Como, MD  tiZANidine (ZANAFLEX) 2 MG tablet Take 1-2 tablets (2-4 mg total) by mouth every 6 (six) hours as needed for  muscle spasms. Patient not taking: Reported on 10/02/2018 08/12/18   Hilts, Michael, MD    Allergies    Onabotulinumtoxina, Codeine, and Sulfamethoxazole-trimethoprim  Review of Systems   Review of Systems  Constitutional: Negative for chills, fatigue and fever.  Respiratory: Negative for cough and shortness of breath.   Cardiovascular: Negative for chest pain.  Gastrointestinal: Positive for abdominal pain, diarrhea and nausea. Negative for blood in stool (Not BRBPR but does have dark stools. ), rectal pain and vomiting.  Genitourinary: Negative for dysuria and urgency.  Skin: Negative for color change and rash.  Neurological: Negative for weakness and headaches.  Psychiatric/Behavioral: Negative for confusion.  All other systems reviewed and are negative.   Physical Exam Updated Vital Signs BP (!) 150/70 (BP Location: Right Arm)   Pulse 85   Temp 98.9 F (37.2 C) (Oral)   Resp 20   Ht 5\' 3"  (1.6 m)   Wt 65.8 kg   SpO2 96%   BMI 25.69 kg/m   Physical Exam Vitals and nursing note reviewed. Exam conducted with a chaperone present.  Constitutional:      Appearance: She is well-developed.     Comments: Patient appears to feel generally unwell however is not an extremis or distress.  HENT:     Head: Normocephalic and atraumatic.  Eyes:     Conjunctiva/sclera: Conjunctivae normal.  Cardiovascular:     Rate and Rhythm: Normal rate and regular rhythm.     Pulses: Normal pulses.     Heart sounds: Normal heart sounds. No murmur heard.   Pulmonary:     Effort: Pulmonary effort is normal. No respiratory distress.     Breath sounds: Normal breath sounds.  Abdominal:     General: Bowel sounds are increased.     Palpations: Abdomen is soft.     Tenderness: There is no abdominal tenderness. There is no guarding.     Comments: Bowel sounds are significantly increased, audible without use of stethoscope.  Genitourinary:    Comments: Rectal exam shows skin tags around the anus  consistent with prior hemorrhoids however no obvious inflamed or thrombosed hemorrhoids.  No stool in the rectal vault.  No fissures detected on exam. Musculoskeletal:        General: Normal range of motion.     Cervical back: Normal range of motion and neck supple.     Right lower leg: No edema.     Left lower leg: No edema.  Skin:    General: Skin is warm and dry.  Neurological:     General: No focal deficit present.     Mental Status: She is alert and oriented to person, place, and time.  Psychiatric:        Mood and Affect:  Mood normal.        Behavior: Behavior normal.     ED Results / Procedures / Treatments   Labs (all labs ordered are listed, but only abnormal results are displayed) Labs Reviewed  COMPREHENSIVE METABOLIC PANEL - Abnormal; Notable for the following components:      Result Value   Glucose, Bld 123 (*)    BUN 32 (*)    All other components within normal limits  POC OCCULT BLOOD, ED - Abnormal; Notable for the following components:   Fecal Occult Bld POSITIVE (*)    All other components within normal limits  GASTROINTESTINAL PANEL BY PCR, STOOL (REPLACES STOOL CULTURE)  CBC WITH DIFFERENTIAL/PLATELET  PROTIME-INR  TYPE AND SCREEN  ABO/RH    EKG None  Radiology CT Abdomen Pelvis W Contrast  Result Date: 10/19/2019 CLINICAL DATA:  Abdominal pain and diarrhea. EXAM: CT ABDOMEN AND PELVIS WITH CONTRAST TECHNIQUE: Multidetector CT imaging of the abdomen and pelvis was performed using the standard protocol following bolus administration of intravenous contrast. CONTRAST:  137mL OMNIPAQUE IOHEXOL 300 MG/ML  SOLN COMPARISON:  None. FINDINGS: Lower chest: No acute abnormality. Hepatobiliary: No focal liver abnormality is seen. No gallstones, gallbladder wall thickening, or biliary dilatation. Pancreas: Unremarkable. No pancreatic ductal dilatation or surrounding inflammatory changes. Spleen: Normal in size without focal abnormality. Adrenals/Urinary Tract:  Adrenal glands are unremarkable. Kidneys are normal, without renal calculi, focal lesion, or hydronephrosis. Urinary bladder is empty and subsequently limited in evaluation. Stomach/Bowel: There is a large gastric hernia. The appendix is normal. No evidence of bowel dilatation. Noninflamed diverticula are seen within the descending and sigmoid colon. Vascular/Lymphatic: There is mild calcification of the abdominal aorta and bilateral common iliac arteries, without evidence of aneurysmal dilatation. No enlarged abdominal or pelvic lymph nodes. Reproductive: The uterus is normal in size and appearance. A 6.6 cm x 5.6 cm cyst is seen along the posterior aspect of the left adnexa. Other: No abdominal wall hernia or abnormality. No abdominopelvic ascites. Musculoskeletal: A chronic compression fracture deformity is seen at the level of T12. Multilevel degenerative changes are noted throughout the lumbar spine. IMPRESSION: 1. Large gastric hernia. 2. Colonic diverticulosis. 3. 6.6 cm x 5.6 cm left adnexal cyst, likely ovarian in origin. Correlation with pelvic ultrasound is recommended. 4. Chronic compression fracture deformity at the level of T12. 5. Aortic atherosclerosis. Aortic Atherosclerosis (ICD10-I70.0). Electronically Signed   By: Virgina Norfolk M.D.   On: 10/19/2019 17:18    Procedures Procedures (including critical care time)  Medications Ordered in ED Medications  lactated ringers bolus 500 mL (0 mLs Intravenous Stopped 10/19/19 1320)    Followed by  lactated ringers infusion ( Intravenous Stopped 10/19/19 1635)  sodium chloride (PF) 0.9 % injection (has no administration in time range)  iohexol (OMNIPAQUE) 300 MG/ML solution 100 mL (100 mLs Intravenous Contrast Given 10/19/19 1654)    ED Course  I have reviewed the triage vital signs and the nursing notes.  Pertinent labs & imaging results that were available during my care of the patient were reviewed by me and considered in my medical  decision making (see chart for details).    MDM Rules/Calculators/A&P                         Patient is a 79 year old woman who presents today for evaluation of concern of GI bleeding.  She has had 5 episodes of diarrhea since waking up this morning however prior to this for the  past week has had what she describes as coffee-ground appearing stools similar to when she had prior upper GI bleeding.  Additionally she has been camping recently, and while no one else is sick her diarrhea did not start until she had been camping.  Labs are obtained and reviewed, CBC is unremarkable.  CMP shows mild elevation of BUN consistent with mild dehydration, suspect due to her diarrhea.  She is given IV fluids.  PT/INR is normal.  She was occult blood positive however did not have stool in her rectal vault and no frank melena was viewed on my exam.  Given patient's complaints of generally feeling unwell EKG was obtained without evidence of ischemia.    CT abdomen pelvis is obtained.  Shows a large gastric hernia and diverticulosis without diverticulitis.  Patient is informed of incidental findings including cyst in the recommendation for follow-up.  Patient and I had extensive discussion about disposition.  She is afebrile not tachycardic or tachypneic.  She has knowledge on navigating healthcare system as she is a retired [employer] scientist and is already established with [facility].  She is not anemic with a reassuring CT scan, is able to p.o. challenge without significant difficulty or pain and did not have any diarrhea in the 8 hours that she was in the emergency room.  While she did have occult positive and reports dark tarry stools I suspect that the diarrhea that brought her in today may be a second independent process from the color change.    Through shared decision making patient made the informed decision to not pursue inpatient GI consult or admission.  She feels better and would much rather go home tonight.   Recommended that she increase her Protonix from once a day to twice a day in addition to a bland diet and calling her GI tomorrow morning for outpatient follow-up.  Additionally recommended avoiding NSAIDs and alcohol, both of which she does not routinely consume as I suspect she may have a upper GI ulcer.    Return precautions were discussed with patient who states their understanding.  At the time of discharge patient denied any unaddressed complaints or concerns.  Patient is agreeable for discharge home.  Note: Portions of this report may have been transcribed using voice recognition software. Every effort was made to ensure accuracy; however, inadvertent computerized transcription errors may be present  Final Clinical Impression(s) / ED Diagnoses Final diagnoses:  Diarrhea, unspecified type  Occult blood positive stool  Diverticulosis  Hiatal hernia  Adnexal cyst    Rx / DC Orders ED Discharge Orders    None       Lorin Glass, PA-C 10/19/19 2248    Malvin Johns, MD 10/23/19 1642

## 2019-10-19 NOTE — ED Triage Notes (Signed)
Patient repots nausea, coffee ground colored stools. Patient reports she has has ulcers previously and this is what occurred last time.

## 2019-10-21 DIAGNOSIS — M546 Pain in thoracic spine: Secondary | ICD-10-CM | POA: Diagnosis not present

## 2019-10-21 DIAGNOSIS — H04123 Dry eye syndrome of bilateral lacrimal glands: Secondary | ICD-10-CM | POA: Diagnosis not present

## 2019-10-21 DIAGNOSIS — M25551 Pain in right hip: Secondary | ICD-10-CM | POA: Diagnosis not present

## 2019-10-22 DIAGNOSIS — K922 Gastrointestinal hemorrhage, unspecified: Secondary | ICD-10-CM | POA: Diagnosis not present

## 2019-10-23 DIAGNOSIS — M25551 Pain in right hip: Secondary | ICD-10-CM | POA: Diagnosis not present

## 2019-10-23 DIAGNOSIS — M546 Pain in thoracic spine: Secondary | ICD-10-CM | POA: Diagnosis not present

## 2019-10-24 DIAGNOSIS — K921 Melena: Secondary | ICD-10-CM | POA: Diagnosis not present

## 2019-10-24 DIAGNOSIS — R197 Diarrhea, unspecified: Secondary | ICD-10-CM | POA: Diagnosis not present

## 2019-10-24 DIAGNOSIS — N83202 Unspecified ovarian cyst, left side: Secondary | ICD-10-CM | POA: Diagnosis not present

## 2019-10-24 DIAGNOSIS — K449 Diaphragmatic hernia without obstruction or gangrene: Secondary | ICD-10-CM | POA: Diagnosis not present

## 2019-10-28 DIAGNOSIS — R197 Diarrhea, unspecified: Secondary | ICD-10-CM | POA: Diagnosis not present

## 2019-10-28 DIAGNOSIS — K921 Melena: Secondary | ICD-10-CM | POA: Diagnosis not present

## 2019-11-01 ENCOUNTER — Other Ambulatory Visit: Payer: Self-pay

## 2019-11-01 ENCOUNTER — Ambulatory Visit
Admission: RE | Admit: 2019-11-01 | Discharge: 2019-11-01 | Disposition: A | Discharge status: Home / Self Care | Payer: Medicare Other | Source: Ambulatory Visit | Attending: Orthopedic Surgery | Admitting: Orthopedic Surgery

## 2019-11-01 DIAGNOSIS — G8929 Other chronic pain: Secondary | ICD-10-CM

## 2019-11-01 DIAGNOSIS — M545 Low back pain, unspecified: Secondary | ICD-10-CM | POA: Diagnosis not present

## 2019-11-01 DIAGNOSIS — M48061 Spinal stenosis, lumbar region without neurogenic claudication: Secondary | ICD-10-CM | POA: Diagnosis not present

## 2019-11-02 DIAGNOSIS — Z20822 Contact with and (suspected) exposure to covid-19: Secondary | ICD-10-CM | POA: Diagnosis not present

## 2019-11-04 DIAGNOSIS — M25551 Pain in right hip: Secondary | ICD-10-CM | POA: Diagnosis not present

## 2019-11-04 DIAGNOSIS — M546 Pain in thoracic spine: Secondary | ICD-10-CM | POA: Diagnosis not present

## 2019-11-12 ENCOUNTER — Other Ambulatory Visit: Payer: Self-pay

## 2019-11-12 ENCOUNTER — Ambulatory Visit (INDEPENDENT_AMBULATORY_CARE_PROVIDER_SITE_OTHER): Payer: Medicare Other | Admitting: Orthopedic Surgery

## 2019-11-12 DIAGNOSIS — M545 Low back pain, unspecified: Secondary | ICD-10-CM

## 2019-11-12 DIAGNOSIS — G8929 Other chronic pain: Secondary | ICD-10-CM | POA: Diagnosis not present

## 2019-11-13 DIAGNOSIS — M25551 Pain in right hip: Secondary | ICD-10-CM | POA: Diagnosis not present

## 2019-11-13 DIAGNOSIS — M546 Pain in thoracic spine: Secondary | ICD-10-CM | POA: Diagnosis not present

## 2019-11-15 ENCOUNTER — Encounter: Payer: Self-pay | Admitting: Orthopedic Surgery

## 2019-11-15 NOTE — Progress Notes (Signed)
   Office Visit Note   Patient: Emily Bates           Date of Birth: [birth year]           MRN: [id number] Visit Date: 11/12/2019 Requested by: Lajean Manes, MD [address] [place],  [place] [address] PCP: Lajean Manes, MD  Subjective: Chief Complaint  Patient presents with  . scan review    HPI: Emily Bates is a 79 year old patient with low back pain.  Physical therapy helps her a lot.  She wants to continue.  She is also doing stretching and strengthening exercises at home.  Dry needling has helped her back.  Has primarily right-sided symptoms but no saddle paresthesias or red flag symptoms.              ROS: All systems reviewed are negative as they relate to the chief complaint within the history of present illness.  Patient denies  fevers or chills.   Assessment & Plan: Visit Diagnoses:  1. Chronic bilateral low back pain, unspecified whether sciatica present     Plan: Impression is low back pain.  Right-sided symptoms predominate.  Continue therapy 1-2 times a week for 6 weeks progressing to home exercise program and follow-up with me as needed.  No indications for epidural steroid injections yet but that would be a consideration as a next step.  Follow-Up Instructions: Return if symptoms worsen or fail to improve.   Orders:  No orders of the defined types were placed in this encounter.  No orders of the defined types were placed in this encounter.     Procedures: No procedures performed   Clinical Data: No additional findings.  Objective: Vital Signs: There were no vitals taken for this visit.  Physical Exam:   Constitutional: Patient appears well-developed HEENT:  Head: Normocephalic Eyes:EOM are normal Neck: Normal range of motion Cardiovascular: Normal rate Pulmonary/chest: Effort normal Neurologic: Patient is alert Skin: Skin is warm Psychiatric: Patient has normal mood and affect    Ortho Exam: Ortho exam demonstrates  normal gait alignment.  No nerve root tension signs.  Ankle dorsiflexion plantarflexion strength is intact.  No groin pain with internal X rotation of the leg.  No trochanteric tenderness is noted.  Patient has pretty reasonable flexibility and no paresthesias L1 S1 bilaterally.  Specialty Comments:  No specialty comments available.  Imaging: No results found.   PMFS History: There are no problems to display for this patient.  History reviewed. No pertinent past medical history.  History reviewed. No pertinent family history.  Past Surgical History:  Procedure Laterality Date  . BREAST EXCISIONAL BIOPSY Right 2019   Social History   Occupational History  . Not on file  Tobacco Use  . Smoking status: Never Smoker  . Smokeless tobacco: Never Used  Substance and Sexual Activity  . Alcohol use: Yes    Comment: rarely  . Drug use: Never  . Sexual activity: Not on file

## 2019-11-18 DIAGNOSIS — M546 Pain in thoracic spine: Secondary | ICD-10-CM | POA: Diagnosis not present

## 2019-11-18 DIAGNOSIS — M25551 Pain in right hip: Secondary | ICD-10-CM | POA: Diagnosis not present

## 2019-11-21 DIAGNOSIS — M546 Pain in thoracic spine: Secondary | ICD-10-CM | POA: Diagnosis not present

## 2019-11-21 DIAGNOSIS — M25551 Pain in right hip: Secondary | ICD-10-CM | POA: Diagnosis not present

## 2019-11-24 DIAGNOSIS — M546 Pain in thoracic spine: Secondary | ICD-10-CM | POA: Diagnosis not present

## 2019-11-24 DIAGNOSIS — Z1159 Encounter for screening for other viral diseases: Secondary | ICD-10-CM | POA: Diagnosis not present

## 2019-11-24 DIAGNOSIS — M25551 Pain in right hip: Secondary | ICD-10-CM | POA: Diagnosis not present

## 2019-11-26 DIAGNOSIS — M25551 Pain in right hip: Secondary | ICD-10-CM | POA: Diagnosis not present

## 2019-11-26 DIAGNOSIS — M546 Pain in thoracic spine: Secondary | ICD-10-CM | POA: Diagnosis not present

## 2019-11-27 DIAGNOSIS — K317 Polyp of stomach and duodenum: Secondary | ICD-10-CM | POA: Diagnosis not present

## 2019-11-27 DIAGNOSIS — K2980 Duodenitis without bleeding: Secondary | ICD-10-CM | POA: Diagnosis not present

## 2019-11-27 DIAGNOSIS — K449 Diaphragmatic hernia without obstruction or gangrene: Secondary | ICD-10-CM | POA: Diagnosis not present

## 2019-11-27 DIAGNOSIS — K2930 Chronic superficial gastritis without bleeding: Secondary | ICD-10-CM | POA: Diagnosis not present

## 2019-11-27 DIAGNOSIS — K3189 Other diseases of stomach and duodenum: Secondary | ICD-10-CM | POA: Diagnosis not present

## 2019-12-03 DIAGNOSIS — K219 Gastro-esophageal reflux disease without esophagitis: Secondary | ICD-10-CM | POA: Diagnosis not present

## 2019-12-03 DIAGNOSIS — D509 Iron deficiency anemia, unspecified: Secondary | ICD-10-CM | POA: Diagnosis not present

## 2019-12-03 DIAGNOSIS — E7800 Pure hypercholesterolemia, unspecified: Secondary | ICD-10-CM | POA: Diagnosis not present

## 2019-12-03 DIAGNOSIS — K2980 Duodenitis without bleeding: Secondary | ICD-10-CM | POA: Diagnosis not present

## 2019-12-03 DIAGNOSIS — K317 Polyp of stomach and duodenum: Secondary | ICD-10-CM | POA: Diagnosis not present

## 2019-12-03 DIAGNOSIS — K2930 Chronic superficial gastritis without bleeding: Secondary | ICD-10-CM | POA: Diagnosis not present

## 2019-12-03 DIAGNOSIS — F325 Major depressive disorder, single episode, in full remission: Secondary | ICD-10-CM | POA: Diagnosis not present

## 2019-12-17 DIAGNOSIS — Z23 Encounter for immunization: Secondary | ICD-10-CM | POA: Diagnosis not present

## 2019-12-24 DIAGNOSIS — M25551 Pain in right hip: Secondary | ICD-10-CM | POA: Diagnosis not present

## 2019-12-24 DIAGNOSIS — M546 Pain in thoracic spine: Secondary | ICD-10-CM | POA: Diagnosis not present

## 2019-12-31 DIAGNOSIS — M546 Pain in thoracic spine: Secondary | ICD-10-CM | POA: Diagnosis not present

## 2019-12-31 DIAGNOSIS — M25551 Pain in right hip: Secondary | ICD-10-CM | POA: Diagnosis not present

## 2020-01-05 ENCOUNTER — Other Ambulatory Visit: Payer: Self-pay | Admitting: Geriatric Medicine

## 2020-01-05 DIAGNOSIS — Z1231 Encounter for screening mammogram for malignant neoplasm of breast: Secondary | ICD-10-CM

## 2020-01-13 DIAGNOSIS — M546 Pain in thoracic spine: Secondary | ICD-10-CM | POA: Diagnosis not present

## 2020-01-13 DIAGNOSIS — M25551 Pain in right hip: Secondary | ICD-10-CM | POA: Diagnosis not present

## 2020-01-22 DIAGNOSIS — M25551 Pain in right hip: Secondary | ICD-10-CM | POA: Diagnosis not present

## 2020-01-22 DIAGNOSIS — M546 Pain in thoracic spine: Secondary | ICD-10-CM | POA: Diagnosis not present

## 2020-02-03 DIAGNOSIS — M546 Pain in thoracic spine: Secondary | ICD-10-CM | POA: Diagnosis not present

## 2020-02-03 DIAGNOSIS — M25551 Pain in right hip: Secondary | ICD-10-CM | POA: Diagnosis not present

## 2020-02-12 ENCOUNTER — Other Ambulatory Visit: Payer: Self-pay | Admitting: Family Medicine

## 2020-02-12 DIAGNOSIS — M25551 Pain in right hip: Secondary | ICD-10-CM | POA: Diagnosis not present

## 2020-02-12 DIAGNOSIS — M546 Pain in thoracic spine: Secondary | ICD-10-CM | POA: Diagnosis not present

## 2020-02-13 ENCOUNTER — Ambulatory Visit: Payer: Medicare Other

## 2020-02-18 DIAGNOSIS — M25551 Pain in right hip: Secondary | ICD-10-CM | POA: Diagnosis not present

## 2020-02-18 DIAGNOSIS — M546 Pain in thoracic spine: Secondary | ICD-10-CM | POA: Diagnosis not present

## 2020-02-26 DIAGNOSIS — M25551 Pain in right hip: Secondary | ICD-10-CM | POA: Diagnosis not present

## 2020-02-26 DIAGNOSIS — M546 Pain in thoracic spine: Secondary | ICD-10-CM | POA: Diagnosis not present

## 2020-03-08 DIAGNOSIS — E7800 Pure hypercholesterolemia, unspecified: Secondary | ICD-10-CM | POA: Diagnosis not present

## 2020-03-11 DIAGNOSIS — M546 Pain in thoracic spine: Secondary | ICD-10-CM | POA: Diagnosis not present

## 2020-03-11 DIAGNOSIS — M25551 Pain in right hip: Secondary | ICD-10-CM | POA: Diagnosis not present

## 2020-03-18 DIAGNOSIS — M25551 Pain in right hip: Secondary | ICD-10-CM | POA: Diagnosis not present

## 2020-03-18 DIAGNOSIS — M546 Pain in thoracic spine: Secondary | ICD-10-CM | POA: Diagnosis not present

## 2020-03-29 ENCOUNTER — Other Ambulatory Visit: Payer: Self-pay

## 2020-03-29 ENCOUNTER — Ambulatory Visit
Admission: RE | Admit: 2020-03-29 | Discharge: 2020-03-29 | Disposition: A | Discharge status: Home / Self Care | Payer: Medicare Other | Source: Ambulatory Visit | Attending: Geriatric Medicine | Admitting: Geriatric Medicine

## 2020-03-29 ENCOUNTER — Inpatient Hospital Stay: Admission: RE | Admit: 2020-03-29 | Payer: Medicare Other | Source: Ambulatory Visit

## 2020-03-29 DIAGNOSIS — Z1231 Encounter for screening mammogram for malignant neoplasm of breast: Secondary | ICD-10-CM

## 2020-05-12 DIAGNOSIS — K219 Gastro-esophageal reflux disease without esophagitis: Secondary | ICD-10-CM | POA: Diagnosis not present

## 2020-05-12 DIAGNOSIS — D509 Iron deficiency anemia, unspecified: Secondary | ICD-10-CM | POA: Diagnosis not present

## 2020-05-12 DIAGNOSIS — E7800 Pure hypercholesterolemia, unspecified: Secondary | ICD-10-CM | POA: Diagnosis not present

## 2020-05-12 DIAGNOSIS — F325 Major depressive disorder, single episode, in full remission: Secondary | ICD-10-CM | POA: Diagnosis not present

## 2020-05-21 DIAGNOSIS — Z79899 Other long term (current) drug therapy: Secondary | ICD-10-CM | POA: Diagnosis not present

## 2020-05-21 DIAGNOSIS — R030 Elevated blood-pressure reading, without diagnosis of hypertension: Secondary | ICD-10-CM | POA: Diagnosis not present

## 2020-05-21 DIAGNOSIS — Z23 Encounter for immunization: Secondary | ICD-10-CM | POA: Diagnosis not present

## 2020-05-21 DIAGNOSIS — E7800 Pure hypercholesterolemia, unspecified: Secondary | ICD-10-CM | POA: Diagnosis not present

## 2020-05-21 DIAGNOSIS — I471 Supraventricular tachycardia: Secondary | ICD-10-CM | POA: Diagnosis not present

## 2020-06-29 ENCOUNTER — Telehealth: Payer: Self-pay | Admitting: Orthopedic Surgery

## 2020-06-29 NOTE — Telephone Encounter (Signed)
Order faxed patient notified

## 2020-06-29 NOTE — Telephone Encounter (Signed)
Pt called stating she would like to have a referral for PT be sent to [facility]; and she states she wants to do Dry needling for her L hip. Pt would like a CB when this has been sent .  [phone]

## 2020-06-29 NOTE — Telephone Encounter (Signed)
Okay to send.  I think I have seen her for her hip before.  I would just give it the caveat that she needs to come in for follow-up approximately 2 weeks to 3 weeks after initiation of treatment if it has not been helpful.

## 2020-07-14 DIAGNOSIS — K219 Gastro-esophageal reflux disease without esophagitis: Secondary | ICD-10-CM | POA: Diagnosis not present

## 2020-07-14 DIAGNOSIS — F325 Major depressive disorder, single episode, in full remission: Secondary | ICD-10-CM | POA: Diagnosis not present

## 2020-07-14 DIAGNOSIS — D509 Iron deficiency anemia, unspecified: Secondary | ICD-10-CM | POA: Diagnosis not present

## 2020-07-14 DIAGNOSIS — E7800 Pure hypercholesterolemia, unspecified: Secondary | ICD-10-CM | POA: Diagnosis not present

## 2020-07-15 DIAGNOSIS — M25551 Pain in right hip: Secondary | ICD-10-CM | POA: Diagnosis not present

## 2020-07-15 DIAGNOSIS — M546 Pain in thoracic spine: Secondary | ICD-10-CM | POA: Diagnosis not present

## 2020-07-22 DIAGNOSIS — M546 Pain in thoracic spine: Secondary | ICD-10-CM | POA: Diagnosis not present

## 2020-07-22 DIAGNOSIS — M25551 Pain in right hip: Secondary | ICD-10-CM | POA: Diagnosis not present

## 2020-08-03 DIAGNOSIS — Z23 Encounter for immunization: Secondary | ICD-10-CM | POA: Diagnosis not present

## 2020-09-02 DIAGNOSIS — K219 Gastro-esophageal reflux disease without esophagitis: Secondary | ICD-10-CM | POA: Diagnosis not present

## 2020-09-02 DIAGNOSIS — F325 Major depressive disorder, single episode, in full remission: Secondary | ICD-10-CM | POA: Diagnosis not present

## 2020-09-02 DIAGNOSIS — E7800 Pure hypercholesterolemia, unspecified: Secondary | ICD-10-CM | POA: Diagnosis not present

## 2020-09-02 DIAGNOSIS — D509 Iron deficiency anemia, unspecified: Secondary | ICD-10-CM | POA: Diagnosis not present

## 2020-10-25 DIAGNOSIS — H903 Sensorineural hearing loss, bilateral: Secondary | ICD-10-CM | POA: Diagnosis not present

## 2020-10-28 DIAGNOSIS — E7800 Pure hypercholesterolemia, unspecified: Secondary | ICD-10-CM | POA: Diagnosis not present

## 2020-10-28 DIAGNOSIS — Z1389 Encounter for screening for other disorder: Secondary | ICD-10-CM | POA: Diagnosis not present

## 2020-10-28 DIAGNOSIS — Z0000 Encounter for general adult medical examination without abnormal findings: Secondary | ICD-10-CM | POA: Diagnosis not present

## 2020-10-28 DIAGNOSIS — D509 Iron deficiency anemia, unspecified: Secondary | ICD-10-CM | POA: Diagnosis not present

## 2020-10-28 DIAGNOSIS — K219 Gastro-esophageal reflux disease without esophagitis: Secondary | ICD-10-CM | POA: Diagnosis not present

## 2020-10-28 DIAGNOSIS — I471 Supraventricular tachycardia: Secondary | ICD-10-CM | POA: Diagnosis not present

## 2020-10-28 DIAGNOSIS — I700 Atherosclerosis of aorta: Secondary | ICD-10-CM | POA: Diagnosis not present

## 2020-10-28 DIAGNOSIS — Z23 Encounter for immunization: Secondary | ICD-10-CM | POA: Diagnosis not present

## 2020-10-28 DIAGNOSIS — R030 Elevated blood-pressure reading, without diagnosis of hypertension: Secondary | ICD-10-CM | POA: Diagnosis not present

## 2020-10-28 DIAGNOSIS — K9089 Other intestinal malabsorption: Secondary | ICD-10-CM | POA: Diagnosis not present

## 2020-10-28 DIAGNOSIS — G4730 Sleep apnea, unspecified: Secondary | ICD-10-CM | POA: Diagnosis not present

## 2020-10-28 DIAGNOSIS — Z79899 Other long term (current) drug therapy: Secondary | ICD-10-CM | POA: Diagnosis not present

## 2020-11-29 DIAGNOSIS — Z961 Presence of intraocular lens: Secondary | ICD-10-CM | POA: Diagnosis not present

## 2020-11-29 DIAGNOSIS — H04123 Dry eye syndrome of bilateral lacrimal glands: Secondary | ICD-10-CM | POA: Diagnosis not present

## 2020-12-06 DIAGNOSIS — G4733 Obstructive sleep apnea (adult) (pediatric): Secondary | ICD-10-CM | POA: Diagnosis not present

## 2020-12-06 DIAGNOSIS — R0981 Nasal congestion: Secondary | ICD-10-CM | POA: Diagnosis not present

## 2020-12-28 DIAGNOSIS — G4733 Obstructive sleep apnea (adult) (pediatric): Secondary | ICD-10-CM | POA: Diagnosis not present

## 2021-01-04 DIAGNOSIS — Z23 Encounter for immunization: Secondary | ICD-10-CM | POA: Diagnosis not present

## 2021-01-19 DIAGNOSIS — Z6824 Body mass index (BMI) 24.0-24.9, adult: Secondary | ICD-10-CM | POA: Diagnosis not present

## 2021-01-19 DIAGNOSIS — M150 Primary generalized (osteo)arthritis: Secondary | ICD-10-CM | POA: Diagnosis not present

## 2021-01-26 DIAGNOSIS — D509 Iron deficiency anemia, unspecified: Secondary | ICD-10-CM | POA: Diagnosis not present

## 2021-03-16 DIAGNOSIS — K449 Diaphragmatic hernia without obstruction or gangrene: Secondary | ICD-10-CM | POA: Diagnosis not present

## 2021-03-16 DIAGNOSIS — Z862 Personal history of diseases of the blood and blood-forming organs and certain disorders involving the immune mechanism: Secondary | ICD-10-CM | POA: Diagnosis not present

## 2021-03-16 DIAGNOSIS — Z86010 Personal history of colonic polyps: Secondary | ICD-10-CM | POA: Diagnosis not present

## 2021-04-11 DIAGNOSIS — G4733 Obstructive sleep apnea (adult) (pediatric): Secondary | ICD-10-CM | POA: Diagnosis not present

## 2021-07-07 ENCOUNTER — Encounter: Payer: Self-pay | Admitting: Physician Assistant

## 2021-07-07 ENCOUNTER — Ambulatory Visit (INDEPENDENT_AMBULATORY_CARE_PROVIDER_SITE_OTHER): Payer: Medicare Other | Admitting: Physician Assistant

## 2021-07-07 DIAGNOSIS — D485 Neoplasm of uncertain behavior of skin: Secondary | ICD-10-CM | POA: Diagnosis not present

## 2021-07-07 DIAGNOSIS — Z808 Family history of malignant neoplasm of other organs or systems: Secondary | ICD-10-CM | POA: Diagnosis not present

## 2021-07-07 DIAGNOSIS — Z1283 Encounter for screening for malignant neoplasm of skin: Secondary | ICD-10-CM

## 2021-07-07 DIAGNOSIS — L570 Actinic keratosis: Secondary | ICD-10-CM | POA: Diagnosis not present

## 2021-07-07 NOTE — Patient Instructions (Signed)

## 2021-07-09 NOTE — Progress Notes (Signed)
   New Patient   Subjective  Emily Bates is a 81 y.o. female who presents for the following: Annual Exam (New lesion x 2 years-  itches and bleeds and just bother me, back of neck x 1 year- no itch or bleed just wants checked and  around neck- "like extra skin" . No personal history of melanoma or non mole skin cancer. Family history of non mole skin cancer but no melanoma. ).   The following portions of the chart were reviewed this encounter and updated as appropriate:  Tobacco  Allergies  Meds  Problems  Med Hx  Surg Hx  Fam Hx      Objective  Well appearing patient in no apparent distress; mood and affect are within normal limits.  A full examination was performed including scalp, head, eyes, ears, nose, lips, neck, chest, axillae, abdomen, back, buttocks, bilateral upper extremities, bilateral lower extremities, hands, feet, fingers, toes, fingernails, and toenails. All findings within normal limits unless otherwise noted below.  No atypical nevi or signs noted at the time of the visit.   Left Scaphoid Fossa Ulceration on an  erythematous base.       Mid Back (2), Right Anterior Neck, Right Forearm - Posterior Erythematous patches with gritty scale.   Assessment & Plan  Encounter for screening for malignant neoplasm of skin  Yearly skin examination   Neoplasm of uncertain behavior of skin Left Scaphoid Fossa  Skin / nail biopsy Type of biopsy: tangential   Informed consent: discussed and consent obtained   Timeout: patient name, date of birth, surgical site, and procedure verified   Procedure prep:  Patient was prepped and draped in usual sterile fashion (Non sterile) Prep type:  Chlorhexidine Anesthesia: the lesion was anesthetized in a standard fashion   Anesthetic:  1% lidocaine w/ epinephrine 1-100,000 local infiltration Instrument used: flexible razor blade   Outcome: patient tolerated procedure well   Post-procedure details: wound care  instructions given    Destruction of lesion Complexity: simple   Destruction method: electrodesiccation and curettage   Informed consent: discussed and consent obtained   Timeout:  patient name, date of birth, surgical site, and procedure verified Anesthesia: the lesion was anesthetized in a standard fashion   Anesthetic:  1% lidocaine w/ epinephrine 1-100,000 local infiltration Curettage performed in three different directions: Yes   Electrodesiccation performed over the curetted area: Yes   Curettage cycles:  3 Margin per side (cm):  0.1 Final wound size (cm):  1.4 Hemostasis achieved with:  aluminum chloride Outcome: patient tolerated procedure well with no complications   Post-procedure details: wound care instructions given    Specimen 1 - Surgical pathology Differential Diagnosis: bcc vs scc- txpbx  Check Margins: yes  AK (actinic keratosis) (4) Right Forearm - Posterior; Mid Back (2); Right Anterior Neck  Destruction of lesion - Mid Back, Right Anterior Neck, Right Forearm - Posterior Complexity: simple   Destruction method: cryotherapy   Informed consent: discussed and consent obtained   Timeout:  patient name, date of birth, surgical site, and procedure verified Lesion destroyed using liquid nitrogen: Yes   Cryotherapy cycles:  3 Outcome: patient tolerated procedure well with no complications       I, Esequiel Kleinfelter, PA-C, have reviewed all documentation's for this visit.  The documentation on 07/20/21 for the exam, diagnosis, procedures and orders are all accurate and complete.

## 2021-07-15 DIAGNOSIS — K5730 Diverticulosis of large intestine without perforation or abscess without bleeding: Secondary | ICD-10-CM | POA: Diagnosis not present

## 2021-07-15 DIAGNOSIS — Z09 Encounter for follow-up examination after completed treatment for conditions other than malignant neoplasm: Secondary | ICD-10-CM | POA: Diagnosis not present

## 2021-07-15 DIAGNOSIS — Z86010 Personal history of colonic polyps: Secondary | ICD-10-CM | POA: Diagnosis not present

## 2021-07-15 DIAGNOSIS — D123 Benign neoplasm of transverse colon: Secondary | ICD-10-CM | POA: Diagnosis not present

## 2021-07-15 DIAGNOSIS — D122 Benign neoplasm of ascending colon: Secondary | ICD-10-CM | POA: Diagnosis not present

## 2021-07-20 ENCOUNTER — Encounter: Payer: Self-pay | Admitting: Physician Assistant

## 2021-07-20 ENCOUNTER — Telehealth: Payer: Self-pay

## 2021-07-20 NOTE — Telephone Encounter (Signed)
Path to patient

## 2021-07-20 NOTE — Telephone Encounter (Signed)
-----   Message from Warren Danes, [place] sent at 07/20/2021  8:48 AM EDT ----- RTC if recurs

## 2021-07-21 DIAGNOSIS — D123 Benign neoplasm of transverse colon: Secondary | ICD-10-CM | POA: Diagnosis not present

## 2021-08-09 DIAGNOSIS — M546 Pain in thoracic spine: Secondary | ICD-10-CM | POA: Diagnosis not present

## 2021-08-09 DIAGNOSIS — M25551 Pain in right hip: Secondary | ICD-10-CM | POA: Diagnosis not present

## 2021-08-26 DIAGNOSIS — M25551 Pain in right hip: Secondary | ICD-10-CM | POA: Diagnosis not present

## 2021-08-26 DIAGNOSIS — M546 Pain in thoracic spine: Secondary | ICD-10-CM | POA: Diagnosis not present

## 2021-08-27 IMAGING — MR MR LUMBAR SPINE W/O CM
4 of 5 series · 26 of 48 positions shown · non-contrast
Comparison: Plain films August 12, 2018

CLINICAL DATA: Low back pain.

EXAM:
MRI LUMBAR SPINE WITHOUT CONTRAST
TECHNIQUE: Multiplanar, multisequence MR imaging of the lumbar spine was
performed. No intravenous contrast was administered.

[Series 3: T2 · sagittal · 4.0mm · 1.09mm/px · 6 of 17 slices shown (1 of 2)]
[im 1/17]
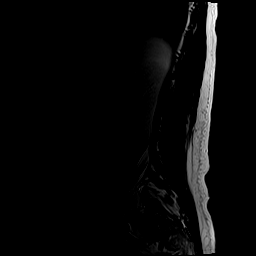
[im 4/17]
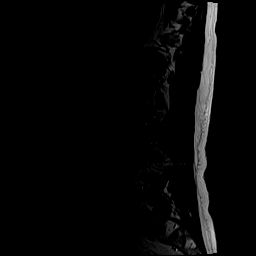
[im 7/17]
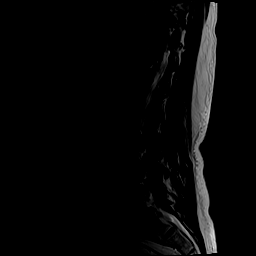
[im 10/17]
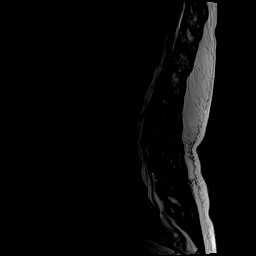
[im 13/17]
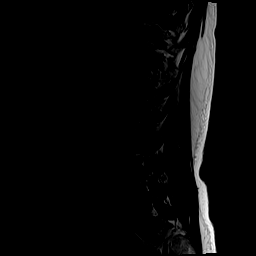
[im 17/17]
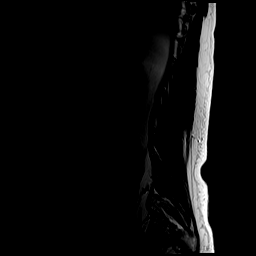

[Series 5: T1 · sagittal · 4.0mm · 1.09mm/px · 5 of 17 slices shown (1 of 2)]
[im 1/17]
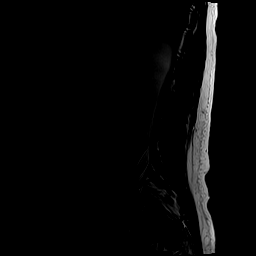
[im 5/17]
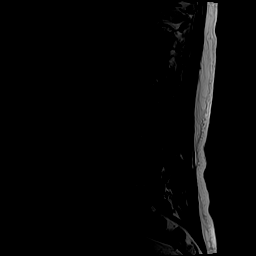
[im 9/17]
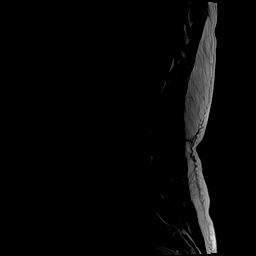
[im 13/17]
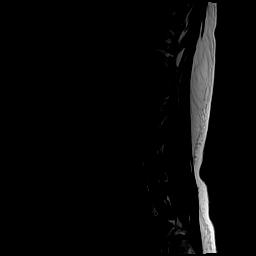
[im 17/17]
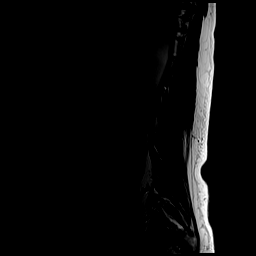

[Series 6: T2 · axial · 4.0mm · 0.39mm/px · z∈[-70,+220]mm · 10 of 56 slices shown (2 of 2)]
[im 4/56]
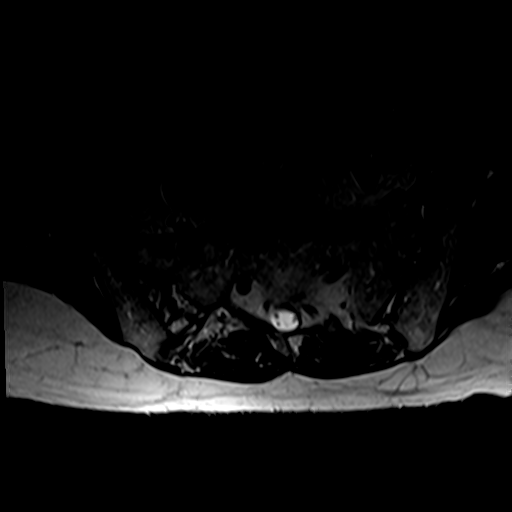
[im 8/56]
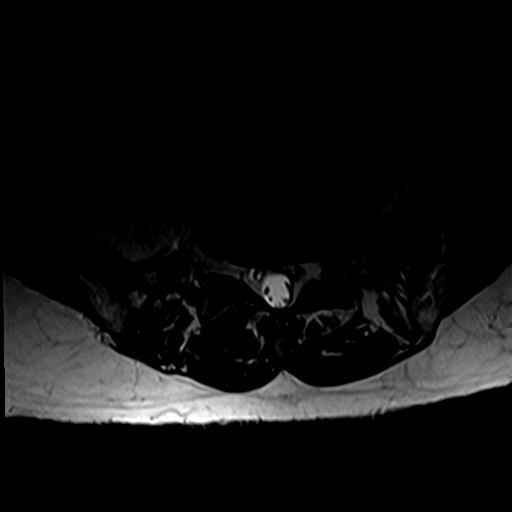
[im 12/56]
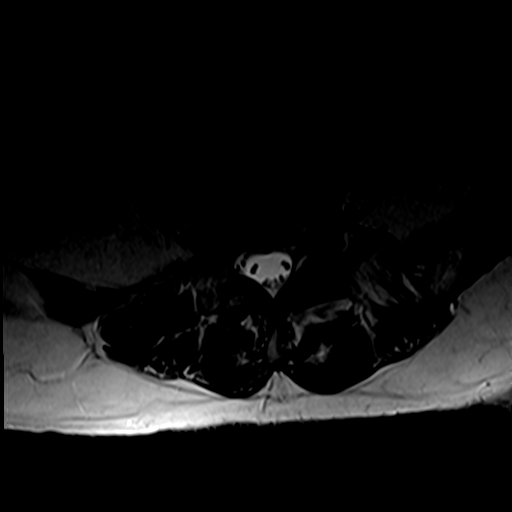
[im 19/56]
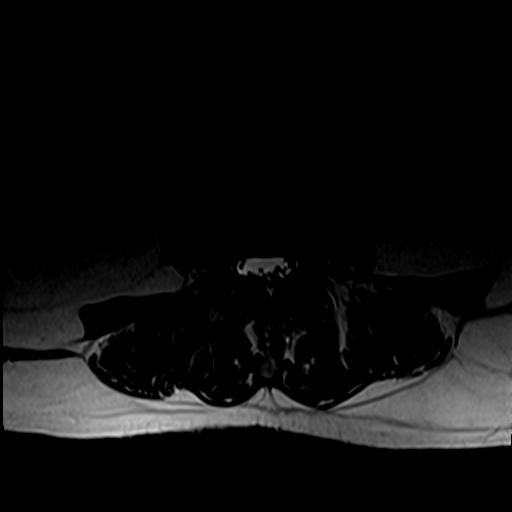
[im 26/56]
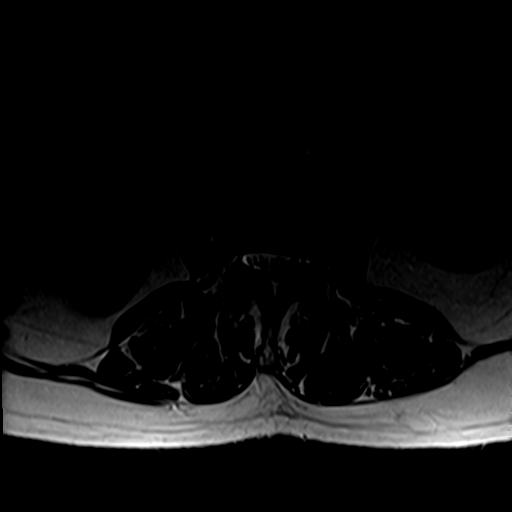
[im 30/56]
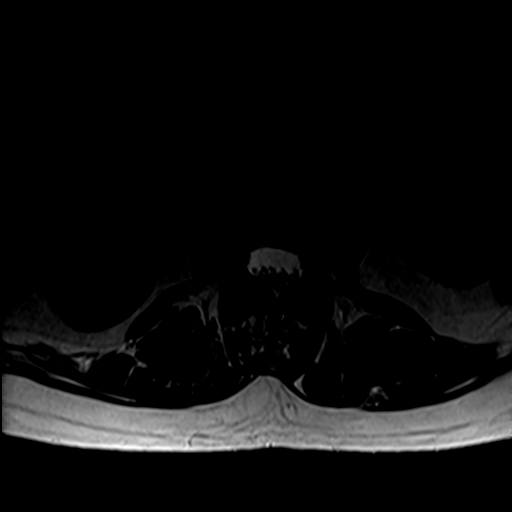
[im 34/56]
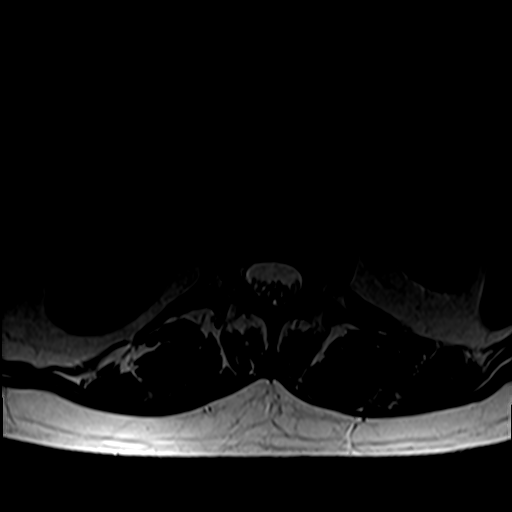
[im 41/56]
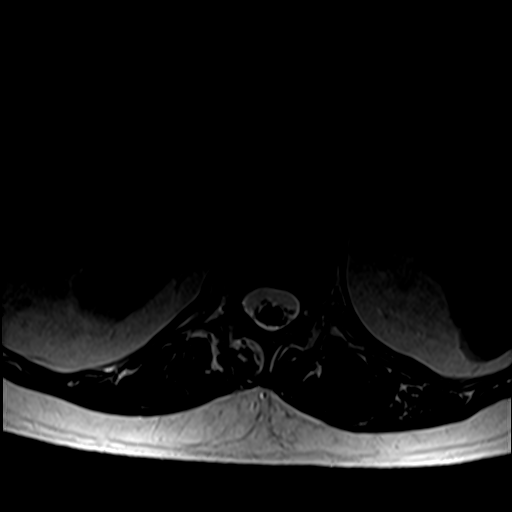
[im 48/56]
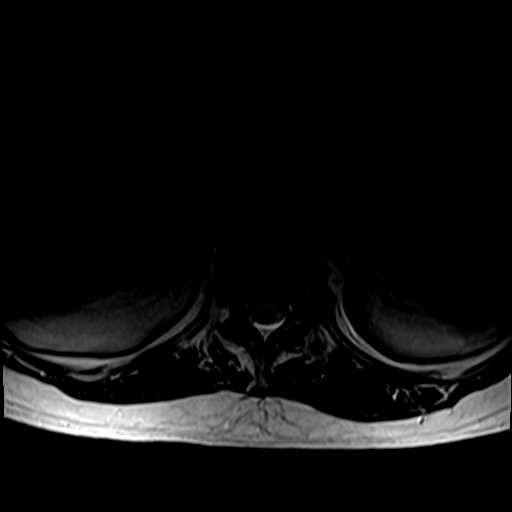
[im 56/56]
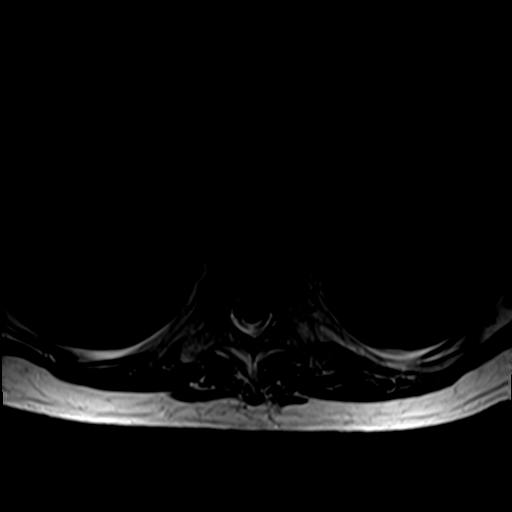

[Series 7: T1 · axial · 4.0mm · 0.39mm/px · z∈[-70,+186]mm · 5 of 56 slices shown (2 of 2)]
[im 4/56]
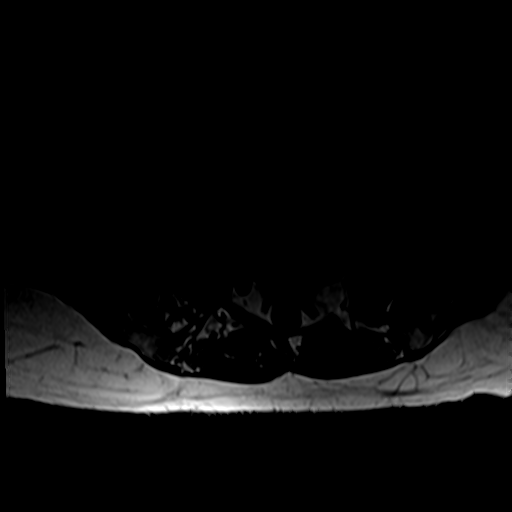
[im 8/56]
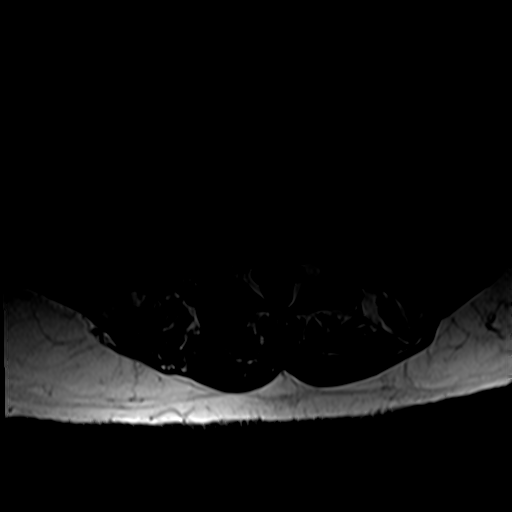
[im 12/56]
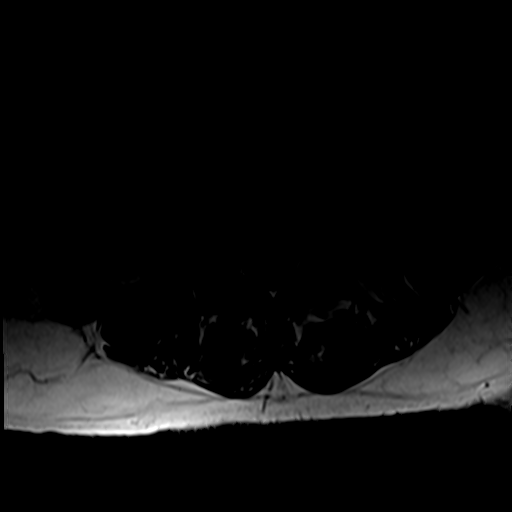
[im 30/56]
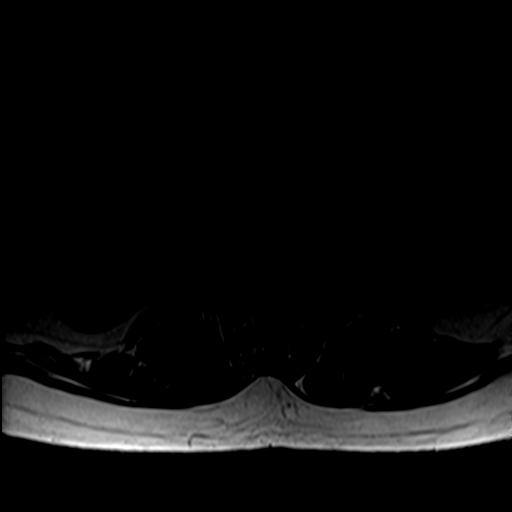
[im 48/56]
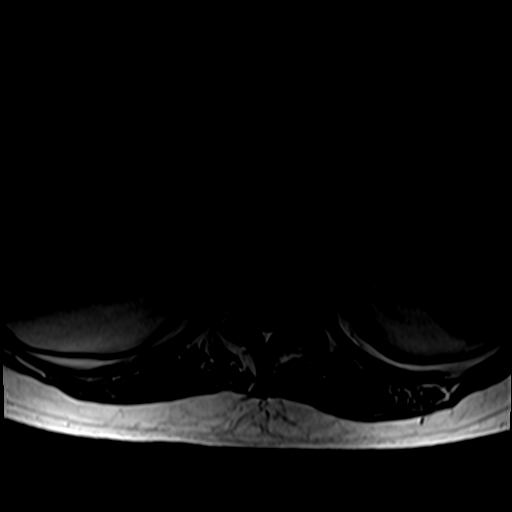

[26 of 48 positions shown; findings below may reference images not displayed]

FINDINGS: Segmentation:  Standard.

Alignment: Levoconvex scoliosis. Small anterolisthesis of L2 over L3
and L3 over L4.

Vertebrae: Chronic compression fractures of T12 and L5. No acute
fracture, evidence of discitis or bone lesion.

Conus medullaris and cauda equina: Conus extends to the L1 level.
Conus and cauda equina appear normal.

Paraspinal and other soft tissues: Large hiatal hernia.

Disc levels:

T12-L1: No spinal canal or neural foraminal stenosis.

L1-2: Shallow disc bulge and mild facet degenerative changes. No
spinal canal or neural foraminal stenosis.

L2-3: Disc bulge, facet degenerative changes and ligamentum flavum
redundancy resulting in mild spinal canal stenosis and mild left
neural foraminal narrowing.

L3-4: Disc bulge, facet degenerative changes and ligamentum flavum
redundancy resulting in mild spinal canal stenosis and mild
bilateral neural foraminal narrowing.

L4-5: Left asymmetric disc bulge, facet degenerative changes and
ligamentum flavum redundancy resulting in mild spinal canal stenosis
with narrowing of the left subarticular zone, mild right and
mild-to-moderate left neural foraminal narrowing.

L5-S1: Left asymmetric disc bulge and facet degenerative changes
resulting in mild left neural foraminal narrowing. No significant
spinal canal stenosis.
IMPRESSION: 1. Mild multilevel degenerative changes of the lumbar spine with
mild spinal canal stenosis at L2-L3, L3-L4 and L4-L5.
2. Mild-to-moderate left neural foraminal narrowing at L4-L5.
3. Large hiatal hernia.

## 2021-09-01 DIAGNOSIS — H02834 Dermatochalasis of left upper eyelid: Secondary | ICD-10-CM | POA: Diagnosis not present

## 2021-09-01 DIAGNOSIS — H02831 Dermatochalasis of right upper eyelid: Secondary | ICD-10-CM | POA: Diagnosis not present

## 2021-11-03 ENCOUNTER — Encounter (HOSPITAL_BASED_OUTPATIENT_CLINIC_OR_DEPARTMENT_OTHER): Payer: Self-pay | Admitting: Plastic Surgery

## 2021-11-03 ENCOUNTER — Other Ambulatory Visit: Payer: Self-pay

## 2021-11-07 DIAGNOSIS — Z0000 Encounter for general adult medical examination without abnormal findings: Secondary | ICD-10-CM | POA: Diagnosis not present

## 2021-11-07 DIAGNOSIS — M810 Age-related osteoporosis without current pathological fracture: Secondary | ICD-10-CM | POA: Diagnosis not present

## 2021-11-07 DIAGNOSIS — E7800 Pure hypercholesterolemia, unspecified: Secondary | ICD-10-CM | POA: Diagnosis not present

## 2021-11-07 DIAGNOSIS — G4730 Sleep apnea, unspecified: Secondary | ICD-10-CM | POA: Diagnosis not present

## 2021-11-07 DIAGNOSIS — K219 Gastro-esophageal reflux disease without esophagitis: Secondary | ICD-10-CM | POA: Diagnosis not present

## 2021-11-07 DIAGNOSIS — I700 Atherosclerosis of aorta: Secondary | ICD-10-CM | POA: Diagnosis not present

## 2021-11-07 DIAGNOSIS — R079 Chest pain, unspecified: Secondary | ICD-10-CM | POA: Diagnosis not present

## 2021-11-07 DIAGNOSIS — I471 Supraventricular tachycardia, unspecified: Secondary | ICD-10-CM | POA: Diagnosis not present

## 2021-11-07 DIAGNOSIS — Z79899 Other long term (current) drug therapy: Secondary | ICD-10-CM | POA: Diagnosis not present

## 2021-11-07 DIAGNOSIS — D509 Iron deficiency anemia, unspecified: Secondary | ICD-10-CM | POA: Diagnosis not present

## 2021-11-09 NOTE — H&P (Signed)
Subjective:     Patient ID: Emily Bates is a 81 y.o. female.   HPI   Presents for upper eyelid surgery. History cosmetic upper and lower blepharoplasty at age 70 Dr. Wendy Poet.History LASIK and cataract surgery. History dry eyes and on Restasis. She has completed taped and untaped VFE with visual field defect noted that resolved with taping.   Reports difficulty seeing, feels tired and desires to close eyes. No glasses or contact use. Remote history Botox use once.   Review of Systems Remainder 12 point review negative    Objective:   Physical Exam Cardiovascular:     Rate and Rhythm: Normal rate. Normal heart sounds Pulmonary:     Effort: Pulmonary effort is normal. Clear to auscultation Skin:    Comments: Fitzpatrick 2  Neurological:     Mental Status: She is oriented to person, place, and time.     HEENT: bilateral upper eyelid dermatochalasis present with upper eyelid skin approaching lid margin Anterior hairline to brows 6 cm EOMI Brisk lower lid snap back, right lower lid nevus present adjacent but not involving lid margin, minimal fat pad protuberance lateral fat pads, laxity skin present    Assessment:     Dermatochalaisis    Plan:     Reviewed upper blepharoplasty incisions, OP surgery, bruising, post op limitiations, sutures. Reviewed changes with aging including brow ptosis, need for additional procedures. Reviewed risks of surgery include worsening of dry eyes, blindness.  Do not anticipate any significant fat removal as likely completed with first surgery.

## 2021-11-11 ENCOUNTER — Encounter (HOSPITAL_BASED_OUTPATIENT_CLINIC_OR_DEPARTMENT_OTHER): Payer: Self-pay | Admitting: Plastic Surgery

## 2021-11-11 ENCOUNTER — Ambulatory Visit (HOSPITAL_BASED_OUTPATIENT_CLINIC_OR_DEPARTMENT_OTHER): Payer: Medicare Other | Admitting: Certified Registered"

## 2021-11-11 ENCOUNTER — Other Ambulatory Visit: Payer: Self-pay

## 2021-11-11 ENCOUNTER — Ambulatory Visit (HOSPITAL_BASED_OUTPATIENT_CLINIC_OR_DEPARTMENT_OTHER)
Admission: RE | Admit: 2021-11-11 | Discharge: 2021-11-11 | Disposition: A | Discharge status: Home / Self Care | Payer: Medicare Other | Attending: Plastic Surgery | Admitting: Plastic Surgery

## 2021-11-11 ENCOUNTER — Encounter (HOSPITAL_BASED_OUTPATIENT_CLINIC_OR_DEPARTMENT_OTHER)
Admission: RE | Disposition: A | Discharge status: Home / Self Care | Payer: Self-pay | Source: Home / Self Care | Attending: Plastic Surgery

## 2021-11-11 DIAGNOSIS — F418 Other specified anxiety disorders: Secondary | ICD-10-CM | POA: Diagnosis not present

## 2021-11-11 DIAGNOSIS — G4730 Sleep apnea, unspecified: Secondary | ICD-10-CM | POA: Diagnosis not present

## 2021-11-11 DIAGNOSIS — Z01818 Encounter for other preprocedural examination: Secondary | ICD-10-CM

## 2021-11-11 DIAGNOSIS — H02833 Dermatochalasis of right eye, unspecified eyelid: Secondary | ICD-10-CM | POA: Diagnosis not present

## 2021-11-11 DIAGNOSIS — G4733 Obstructive sleep apnea (adult) (pediatric): Secondary | ICD-10-CM

## 2021-11-11 DIAGNOSIS — K219 Gastro-esophageal reflux disease without esophagitis: Secondary | ICD-10-CM | POA: Insufficient documentation

## 2021-11-11 DIAGNOSIS — H02831 Dermatochalasis of right upper eyelid: Secondary | ICD-10-CM | POA: Diagnosis not present

## 2021-11-11 DIAGNOSIS — Z9989 Dependence on other enabling machines and devices: Secondary | ICD-10-CM

## 2021-11-11 DIAGNOSIS — H02834 Dermatochalasis of left upper eyelid: Secondary | ICD-10-CM | POA: Diagnosis not present

## 2021-11-11 HISTORY — DX: Depression, unspecified: F32.A

## 2021-11-11 HISTORY — DX: Sleep apnea, unspecified: G47.30

## 2021-11-11 HISTORY — DX: Gastro-esophageal reflux disease without esophagitis: K21.9

## 2021-11-11 HISTORY — DX: Anxiety disorder, unspecified: F41.9

## 2021-11-11 HISTORY — PX: BROW LIFT: SHX178

## 2021-11-11 SURGERY — BLEPHAROPLASTY
Anesthesia: General | Site: Eye | Laterality: Bilateral

## 2021-11-11 MED ORDER — DEXAMETHASONE SODIUM PHOSPHATE 10 MG/ML IJ SOLN
INTRAMUSCULAR | Status: AC
  Filled 2021-11-11: qty 1

## 2021-11-11 MED ORDER — EPHEDRINE SULFATE (PRESSORS) 50 MG/ML IJ SOLN
INTRAMUSCULAR | Status: DC | PRN
  Administered 2021-11-11 (×2): 5 mg via INTRAVENOUS

## 2021-11-11 MED ORDER — LACTATED RINGERS IV SOLN: INTRAVENOUS | Status: DC

## 2021-11-11 MED ORDER — TOBRAMYCIN-DEXAMETHASONE 0.3-0.1 % OP OINT
TOPICAL_OINTMENT | OPHTHALMIC | Status: AC
  Filled 2021-11-11: qty 3.5

## 2021-11-11 MED ORDER — ATROPINE SULFATE 0.4 MG/ML IV SOLN
INTRAVENOUS | Status: AC
  Filled 2021-11-11: qty 1

## 2021-11-11 MED ORDER — PROPOFOL 10 MG/ML IV BOLUS
INTRAVENOUS | Status: AC
  Filled 2021-11-11: qty 20

## 2021-11-11 MED ORDER — ACETAMINOPHEN 500 MG PO TABS
ORAL_TABLET | ORAL | Status: AC
  Filled 2021-11-11: qty 2

## 2021-11-11 MED ORDER — BSS IO SOLN
INTRAOCULAR | Status: AC
  Filled 2021-11-11: qty 15

## 2021-11-11 MED ORDER — TRAMADOL HCL 50 MG PO TABS: 50.0000 mg | ORAL_TABLET | Freq: Four times a day (QID) | ORAL | 0 refills | Status: DC | PRN

## 2021-11-11 MED ORDER — CEFAZOLIN SODIUM-DEXTROSE 2-4 GM/100ML-% IV SOLN
2.0000 g | INTRAVENOUS | Status: AC
  Administered 2021-11-11: 2 g via INTRAVENOUS

## 2021-11-11 MED ORDER — ONDANSETRON HCL 4 MG/2ML IJ SOLN
INTRAMUSCULAR | Status: DC | PRN
  Administered 2021-11-11: 4 mg via INTRAVENOUS

## 2021-11-11 MED ORDER — LIDOCAINE-EPINEPHRINE 1 %-1:100000 IJ SOLN
INTRAMUSCULAR | Status: AC
  Filled 2021-11-11: qty 1

## 2021-11-11 MED ORDER — ARTIFICIAL TEARS OPHTHALMIC OINT
TOPICAL_OINTMENT | OPHTHALMIC | Status: AC
  Filled 2021-11-11: qty 3.5

## 2021-11-11 MED ORDER — ONDANSETRON HCL 4 MG/2ML IJ SOLN
INTRAMUSCULAR | Status: AC
  Filled 2021-11-11: qty 2

## 2021-11-11 MED ORDER — FENTANYL CITRATE (PF) 100 MCG/2ML IJ SOLN
INTRAMUSCULAR | Status: AC
  Filled 2021-11-11: qty 2

## 2021-11-11 MED ORDER — LIDOCAINE HCL (CARDIAC) PF 100 MG/5ML IV SOSY
PREFILLED_SYRINGE | INTRAVENOUS | Status: DC | PRN
  Administered 2021-11-11: 60 mg via INTRAVENOUS

## 2021-11-11 MED ORDER — BUPIVACAINE HCL (PF) 0.25 % IJ SOLN
INTRAMUSCULAR | Status: AC
  Filled 2021-11-11: qty 30

## 2021-11-11 MED ORDER — DEXAMETHASONE SODIUM PHOSPHATE 10 MG/ML IJ SOLN
INTRAMUSCULAR | Status: DC | PRN
  Administered 2021-11-11: 5 mg via INTRAVENOUS

## 2021-11-11 MED ORDER — FENTANYL CITRATE (PF) 100 MCG/2ML IJ SOLN
INTRAMUSCULAR | Status: DC | PRN
  Administered 2021-11-11 (×2): 25 ug via INTRAVENOUS

## 2021-11-11 MED ORDER — LIDOCAINE-EPINEPHRINE 1 %-1:100000 IJ SOLN
INTRAMUSCULAR | Status: DC | PRN
  Administered 2021-11-11: 5 mL

## 2021-11-11 MED ORDER — PROPOFOL 10 MG/ML IV BOLUS
INTRAVENOUS | Status: DC | PRN
  Administered 2021-11-11: 110 mg via INTRAVENOUS

## 2021-11-11 MED ORDER — ACETAMINOPHEN 500 MG PO TABS
1000.0000 mg | ORAL_TABLET | Freq: Once | ORAL | Status: AC
  Administered 2021-11-11: 1000 mg via ORAL

## 2021-11-11 MED ORDER — POVIDONE-IODINE 5 % OP SOLN
OPHTHALMIC | Status: AC
  Filled 2021-11-11: qty 30

## 2021-11-11 MED ORDER — EPHEDRINE 5 MG/ML INJ
INTRAVENOUS | Status: AC
  Filled 2021-11-11: qty 5

## 2021-11-11 MED ORDER — BSS IO SOLN
INTRAOCULAR | Status: DC | PRN
  Administered 2021-11-11: 10 mL via INTRAOCULAR

## 2021-11-11 MED ORDER — TOBRAMYCIN-DEXAMETHASONE 0.3-0.1 % OP SUSP
OPHTHALMIC | Status: AC
  Filled 2021-11-11: qty 2.5

## 2021-11-11 MED ORDER — TOBRAMYCIN 0.3 % OP OINT
TOPICAL_OINTMENT | OPHTHALMIC | Status: DC | PRN
  Administered 2021-11-11: 1 via OPHTHALMIC

## 2021-11-11 MED ORDER — LIDOCAINE 2% (20 MG/ML) 5 ML SYRINGE
INTRAMUSCULAR | Status: AC
  Filled 2021-11-11: qty 5

## 2021-11-11 SURGICAL SUPPLY — 46 items
ADH SKN CLS APL DERMABOND .7 (GAUZE/BANDAGES/DRESSINGS)
APL SRG 3 HI ABS STRL LF PLS (MISCELLANEOUS) ×1
APL SWBSTK 6 STRL LF DISP (MISCELLANEOUS)
APPLICATOR COTTON TIP 6 STRL (MISCELLANEOUS) IMPLANT
APPLICATOR COTTON TIP 6IN STRL (MISCELLANEOUS)
APPLICATOR DR MATTHEWS STRL (MISCELLANEOUS) ×1 IMPLANT
BANDAGE EYE OVAL 2 1/8 X 2 5/8 (GAUZE/BANDAGES/DRESSINGS)
BLADE SURG 15 STRL LF DISP TIS (BLADE) ×1 IMPLANT
BLADE SURG 15 STRL SS (BLADE) ×1
BNDG EYE OVAL 2 1/8 X 2 5/8 (GAUZE/BANDAGES/DRESSINGS) IMPLANT
CORD BIPOLAR FORCEPS 12FT (ELECTRODE) IMPLANT
COVER BACK TABLE 60X90IN (DRAPES) ×1 IMPLANT
COVER MAYO STAND STRL (DRAPES) ×1 IMPLANT
DERMABOND ADVANCED .7 DNX12 (GAUZE/BANDAGES/DRESSINGS) IMPLANT
DRAPE U-SHAPE 76X120 STRL (DRAPES) ×1 IMPLANT
DRAPE UTILITY XL STRL (DRAPES) IMPLANT
ELECT NDL BLADE 2-5/6 (NEEDLE) ×1 IMPLANT
ELECT NEEDLE BLADE 2-5/6 (NEEDLE) ×1 IMPLANT
ELECT REM PT RETURN 9FT ADLT (ELECTROSURGICAL) ×1
ELECTRODE REM PT RTRN 9FT ADLT (ELECTROSURGICAL) ×1 IMPLANT
GLOVE BIO SURGEON STRL SZ 6 (GLOVE) ×1 IMPLANT
GOWN STRL REUS W/ TWL LRG LVL3 (GOWN DISPOSABLE) ×2 IMPLANT
GOWN STRL REUS W/TWL LRG LVL3 (GOWN DISPOSABLE) ×2
NDL HYPO 27GX1-1/4 (NEEDLE) IMPLANT
NDL HYPO 30GX1 BEV (NEEDLE) IMPLANT
NEEDLE HYPO 27GX1-1/4 (NEEDLE) IMPLANT
NEEDLE HYPO 30GX1 BEV (NEEDLE) IMPLANT
PACK BASIN DAY SURGERY FS (CUSTOM PROCEDURE TRAY) ×1 IMPLANT
PENCIL SMOKE EVACUATOR (MISCELLANEOUS) ×1 IMPLANT
SHEILD EYE MED CORNL SHD 22X21 (OPHTHALMIC RELATED) ×1
SHIELD EYE MED CORNL SHD 22X21 (OPHTHALMIC RELATED) ×1 IMPLANT
SLEEVE SCD COMPRESS KNEE MED (STOCKING) ×1 IMPLANT
SPIKE FLUID TRANSFER (MISCELLANEOUS) IMPLANT
STAPLER VISISTAT 35W (STAPLE) IMPLANT
STRIP CLOSURE SKIN 1/2X4 (GAUZE/BANDAGES/DRESSINGS) ×1 IMPLANT
SUT MERSILENE 4 0 P 3 (SUTURE) IMPLANT
SUT MNCRL 6-0 UNDY P1 1X18 (SUTURE) IMPLANT
SUT MONOCRYL 6-0 P1 1X18 (SUTURE)
SUT PLAIN 5 0 P 3 18 (SUTURE) IMPLANT
SUT PROLENE 6 0 P 1 18 (SUTURE) IMPLANT
SUT PROLENE 6 0 PC 1 (SUTURE) IMPLANT
SUT VIC AB 5-0 P-3 18X BRD (SUTURE) IMPLANT
SUT VIC AB 5-0 P3 18 (SUTURE)
SYR CONTROL 10ML LL (SYRINGE) ×1 IMPLANT
TOWEL GREEN STERILE FF (TOWEL DISPOSABLE) ×1 IMPLANT
TRAY DSU PREP LF (CUSTOM PROCEDURE TRAY) ×1 IMPLANT

## 2021-11-11 NOTE — Op Note (Signed)
Operative Note   DATE OF OPERATION: 10.27.23  LOCATION: [facility]-outpatient  SURGICAL DIVISION: Plastic Surgery  PREOPERATIVE DIAGNOSES:  Dermatochalasis bilateral  POSTOPERATIVE DIAGNOSES:  same  PROCEDURE:  Bilateral upper blepharoplasty with excess skin weighing eyelid   SURGEON: Irene Limbo MD MBA  ASSISTANT: none  ANESTHESIA:  General.   EBL: 5 ml  COMPLICATIONS: None immediate.   INDICATIONS FOR PROCEDURE:  The patient, Emily Bates, is a 81 y.o. female born on 1940/11/27, is here for treatment obstructed vision in setting of dermatochalasis.   FINDINGS: Skin only resection performed bilateral.  DESCRIPTION OF PROCEDURE:  The patient's operative site was confirmed with the patient in the preoperative area. The patient was taken to the operating room. SCDs were placed and IV antibiotics were given. Opthalmic lubricant and scleral shields placed bilateral. The patient's operative site was prepped and draped in a sterile fashion. A time out was performed and all information was confirmed to be correct.    Caudal border for resection marked 7 mm from lash line bilateral. Cephalic extent resection marked at junction brow and eyelid skin. Local anesthetic infiltrated to perform bilateral supraorbital and infraorbital nerve blocks and within area of planned resection. I began on right side. Incision completed sharply. Skin only excision completed with aid of cautery. Hemostasis obtained.  Skin closure completed with simple interrupted and running 6-0 prolene.   I then directed attention to left side. Incision completed sharply. Skin only excision completed with aid of cautery. Skin closure completed with simple interrupted and running 6-0 prolene.   Scleral shields removed and eyes irrigated with basic salt solution. Tobradex ointment placed in each eye.   The patient was allowed to wake from anesthesia, extubated and taken to the recovery room in satisfactory  condition.   SPECIMENS: none  DRAINS: none  Irene Limbo, MD [facility]  Office/ physician access line after hours [phone]

## 2021-11-11 NOTE — Anesthesia Procedure Notes (Signed)
Procedure Name: LMA Insertion Date/Time: 11/11/2021 7:24 AM  Performed by: Lavonia Dana, CRNAPre-anesthesia Checklist: Patient identified, Emergency Drugs available, Suction available and Patient being monitored Patient Re-evaluated:Patient Re-evaluated prior to induction Oxygen Delivery Method: Circle system utilized Preoxygenation: Pre-oxygenation with 100% oxygen Induction Type: IV induction Ventilation: Mask ventilation without difficulty LMA: LMA inserted LMA Size: 4.0 Number of attempts: 1 Airway Equipment and Method: Bite block Placement Confirmation: positive ETCO2 Tube secured with: Tape Dental Injury: Teeth and Oropharynx as per pre-operative assessment

## 2021-11-11 NOTE — Discharge Instructions (Signed)

## 2021-11-11 NOTE — Anesthesia Preprocedure Evaluation (Addendum)
Anesthesia Evaluation  Patient identified by MRN, date of birth, ID band Patient awake    Reviewed: Allergy & Precautions, H&P , NPO status , Patient's Chart, lab work & pertinent test results  Airway Mallampati: III  TM Distance: >3 FB Neck ROM: Full    Dental no notable dental hx. (+) Teeth Intact, Dental Advisory Given   Pulmonary sleep apnea and Continuous Positive Airway Pressure Ventilation ,    Pulmonary exam normal breath sounds clear to auscultation       Cardiovascular negative cardio ROS   Rhythm:Regular Rate:Normal     Neuro/Psych Anxiety Depression negative neurological ROS     GI/Hepatic Neg liver ROS, GERD  Medicated,  Endo/Other  negative endocrine ROS  Renal/GU negative Renal ROS  negative genitourinary   Musculoskeletal   Abdominal   Peds  Hematology negative hematology ROS (+)   Anesthesia Other Findings   Reproductive/Obstetrics negative OB ROS                            Anesthesia Physical Anesthesia Plan  ASA: 3  Anesthesia Plan: General   Post-op Pain Management: Tylenol PO (pre-op)*   Induction: Intravenous  PONV Risk Score and Plan: 4 or greater and Ondansetron, Dexamethasone, Treatment may vary due to age or medical condition, Propofol infusion and TIVA  Airway Management Planned: Oral ETT  Additional Equipment:   Intra-op Plan:   Post-operative Plan: Extubation in OR  Informed Consent: I have reviewed the patients History and Physical, chart, labs and discussed the procedure including the risks, benefits and alternatives for the proposed anesthesia with the patient or authorized representative who has indicated his/her understanding and acceptance.     Dental advisory given  Plan Discussed with: CRNA  Anesthesia Plan Comments:         Anesthesia Quick Evaluation

## 2021-11-11 NOTE — Transfer of Care (Signed)
Immediate Anesthesia Transfer of Care Note  Patient: Emily Bates  Procedure(s) Performed: UPPER LID BLEPHAROPLASTY (Bilateral: Eye)  Patient Location: PACU  Anesthesia Type:General  Level of Consciousness: drowsy  Airway & Oxygen Therapy: Patient Spontanous Breathing and Patient connected to face mask oxygen  Post-op Assessment: Report given to RN and Post -op Vital signs reviewed and stable  Post vital signs: Reviewed and stable  Last Vitals:  Vitals Value Taken Time  BP 144/62 11/11/21 0816  Temp    Pulse 69 11/11/21 0817  Resp 13 11/11/21 0817  SpO2 99 % 11/11/21 0817  Vitals shown include unvalidated device data.  Last Pain:  Vitals:   11/11/21 0651  TempSrc: Oral  PainSc: 0-No pain      Patients Stated Pain Goal: 3 (73/57/89 7847)  Complications: No notable events documented.

## 2021-11-11 NOTE — Interval H&P Note (Signed)
History and Physical Interval Note:  11/11/2021 6:45 AM  Nell Range  has presented today for surgery, with the diagnosis of dermatochalasis bilateral.  The various methods of treatment have been discussed with the patient and family. After consideration of risks, benefits and other options for treatment, the patient has consented to  Procedure(s): UPPER LID BLEPHAROPLASTY (Bilateral) as a surgical intervention.  The patient's history has been reviewed, patient examined, no change in status, stable for surgery.  I have reviewed the patient's chart and labs.  Questions were answered to the patient's satisfaction.     Arnoldo Hooker Khiana Camino

## 2021-11-11 NOTE — Anesthesia Postprocedure Evaluation (Signed)
Anesthesia Post Note  Patient: Emily Bates  Procedure(s) Performed: UPPER LID BLEPHAROPLASTY (Bilateral: Eye)     Patient location during evaluation: PACU Anesthesia Type: General Level of consciousness: awake and alert Pain management: pain level controlled Vital Signs Assessment: post-procedure vital signs reviewed and stable Respiratory status: spontaneous breathing, nonlabored ventilation and respiratory function stable Cardiovascular status: blood pressure returned to baseline and stable Postop Assessment: no apparent nausea or vomiting Anesthetic complications: no   No notable events documented.  Last Vitals:  Vitals:   11/11/21 0845 11/11/21 0901  BP: 130/71 135/62  Pulse: 73 76  Resp: (!) 21 20  Temp:  (!) 36.2 C  SpO2: 94% 98%    Last Pain:  Vitals:   11/11/21 0901  TempSrc:   PainSc: 0-No pain                 Kimyata Milich,W. EDMOND

## 2021-11-14 ENCOUNTER — Encounter (HOSPITAL_BASED_OUTPATIENT_CLINIC_OR_DEPARTMENT_OTHER): Payer: Self-pay | Admitting: Plastic Surgery

## 2021-11-14 NOTE — Progress Notes (Signed)
Left message stating courtesy call and if any questions or concerns please call the doctors office.  

## 2021-11-28 DIAGNOSIS — H04123 Dry eye syndrome of bilateral lacrimal glands: Secondary | ICD-10-CM | POA: Diagnosis not present

## 2021-11-28 DIAGNOSIS — Z961 Presence of intraocular lens: Secondary | ICD-10-CM | POA: Diagnosis not present

## 2021-11-28 DIAGNOSIS — H35372 Puckering of macula, left eye: Secondary | ICD-10-CM | POA: Diagnosis not present

## 2021-11-29 ENCOUNTER — Ambulatory Visit: Payer: Medicare Other | Attending: Cardiovascular Disease | Admitting: Cardiovascular Disease

## 2021-11-29 ENCOUNTER — Encounter: Payer: Self-pay | Admitting: Cardiovascular Disease

## 2021-11-29 VITALS — BP 138/72 | HR 66 | Ht 63.5 in | Wt 150.0 lb

## 2021-11-29 DIAGNOSIS — I700 Atherosclerosis of aorta: Secondary | ICD-10-CM | POA: Diagnosis not present

## 2021-11-29 DIAGNOSIS — K449 Diaphragmatic hernia without obstruction or gangrene: Secondary | ICD-10-CM | POA: Insufficient documentation

## 2021-11-29 DIAGNOSIS — G4733 Obstructive sleep apnea (adult) (pediatric): Secondary | ICD-10-CM | POA: Diagnosis not present

## 2021-11-29 DIAGNOSIS — R072 Precordial pain: Secondary | ICD-10-CM | POA: Diagnosis not present

## 2021-11-29 DIAGNOSIS — E782 Mixed hyperlipidemia: Secondary | ICD-10-CM | POA: Insufficient documentation

## 2021-11-29 LAB — BASIC METABOLIC PANEL
BUN/Creatinine Ratio: 18 (ref 12–28)
BUN: 14 mg/dL (ref 8–27)
CO2: 27 mmol/L (ref 20–29)
Calcium: 10.1 mg/dL (ref 8.7–10.3)
Chloride: 101 mmol/L (ref 96–106)
Creatinine, Ser: 0.79 mg/dL (ref 0.57–1.00)
Glucose: 86 mg/dL (ref 70–99)
Potassium: 4.6 mmol/L (ref 3.5–5.2)
Sodium: 138 mmol/L (ref 134–144)
eGFR: 76 mL/min/{1.73_m2} (ref >59)

## 2021-11-29 MED ORDER — METOPROLOL TARTRATE 50 MG PO TABS: ORAL_TABLET | ORAL | 0 refills | Status: DC

## 2021-11-29 NOTE — Patient Instructions (Addendum)
Medication Instructions:  Continue same medciations   Lab Work: Bmet today   Testing/Procedures: Coronary Ct  will be scheduled after approved by insurance      Follow instructions below   Follow-Up: At [facility], you and your health needs are our priority.  As part of our continuing mission to provide you with exceptional heart care, we have created designated Provider Care Teams.  These Care Teams include your primary Cardiologist (physician) and Advanced Practice Providers (APPs -  Physician Assistants and Nurse Practitioners) who all work together to provide you with the care you need, when you need it.  We recommend signing up for the patient portal called "MyChart".  Sign up information is provided on this After Visit Summary.  MyChart is used to connect with patients for Virtual Visits (Telemedicine).  Patients are able to view lab/test results, encounter notes, upcoming appointments, etc.  Non-urgent messages can be sent to your provider as well.   To learn more about what you can do with MyChart, go to [email or web address].    Your next appointment:  After test    The format for your next appointment: Office   Provider: [place]      Your cardiac CT will be scheduled at one of the below locations:   [facility] [address] [place], [place] [address] [phone]  [place] [address] [place], [place] [address] [phone]  [facility] [place], [place] [address] [phone]  If scheduled at [facility], please arrive at the [facility] and Children's Entrance (Entrance C2) of [facility] 30 minutes prior to test start time. You can use the FREE valet parking offered at entrance C (encouraged to control the heart rate for the test)  Proceed to the [facility] Radiology Department  (first floor) to check-in and test prep.  All radiology patients and guests should use entrance C2 at [facility], accessed from [facility], even though the hospital's physical address listed is [address]    If scheduled at [facility] or [facility], please arrive 15 mins early for check-in and test prep.   Please follow these instructions carefully (unless otherwise directed):   On the Night Before the Test: Be sure to Drink plenty of water. Do not consume any caffeinated/decaffeinated beverages or chocolate 12 hours prior to your test. Do not take any antihistamines 12 hours prior to your test.   On the Day of the Test: Drink plenty of water until 1 hour prior to the test. Do not eat any food 1 hour prior to test. You may take your regular medications prior to the test.  Take metoprolol 50 mg  two hours prior to test. HOLD Furosemide/Hydrochlorothiazide morning of the test. Hold Meloxicam morning of test. FEMALES- please wear underwire-free bra if available, avoid dresses & tight clothing        After the Test: Drink plenty of water. After receiving IV contrast, you may experience a mild flushed feeling. This is normal. On occasion, you may experience a mild rash up to 24 hours after the test. This is not dangerous. If this occurs, you can take Benadryl 25 mg and increase your fluid intake. If you experience trouble breathing, this can be serious. If it is severe call 911 IMMEDIATELY. If it is mild, please call  our office. If you take any of these medications: Glipizide/Metformin, Avandament, Glucavance, please do not take 48 hours after completing test unless otherwise instructed.  We will call to schedule your test 2-4 weeks out understanding that some insurance companies will need an authorization prior to the service being performed.   For non-scheduling related questions, please contact the  cardiac imaging nurse navigator should you have any questions/concerns: Marchia Bond, Cardiac Imaging Nurse Navigator Gordy Clement, Cardiac Imaging Nurse Navigator [facility] Direct Office Dial: [phone]   For scheduling needs, including cancellations and rescheduling, please call [place], [phone].    Important Information About Sugar

## 2021-11-29 NOTE — Progress Notes (Signed)
Cardiology Office Note:    Date:  12/01/2021   ID:  Arna Medici, DOB February 01, 1940, MRN [id number]  PCP:  Merlene Laughter, MD   [facility] Providers Cardiologist:  None     Referring MD: Merlene Laughter, MD   Chief Complaint  Patient presents with   Consult  Emily Bates is a 81 y.o. female who is being seen today for the evaluation of chest pain and palpitations at the request of Merlene Laughter, MD.   History of Present Illness:    Emily Bates is a 81 y.o. 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  She has recently had some problems with chest discomfort.  Consistently, the chest discomfort associates palpitations, although it is hard to say what problems first.  Most of the times, although not exclusively, the chest pain and palpitations have occurred during physical exertion.  Most recently this happened during a hiking trip at [facility].  The symptoms lasted for about 35 to 40 minutes.  The discomfort is in the high retrosternal area and radiates towards her neck.  The palpitations are somewhat irregular.  This commonly these happen at rest.  She is generally very physically active and considers herself very fit.  She definitely is much more active than the average 81 year old.  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  She is known to have a large hiatal hernia, but the current symptoms are very different from her previous GERD.  She does not have exertional dyspnea, orthopnea, PND, extremity edema, claudication, focal neurological events.  She reports 100% compliance with CPAP and denies daytime hypersomnolence.  Previous CT of the abdomen shows mild aortic atherosclerosis without evidence of aneurysm and a large hiatal hernia.  She reports that many years ago she wore an ECG monitor that showed minor arrhythmia, paroxysmal SVT up to "17 beats in a row".  I do  not have those tracings or that report available at this time.  She has never been told that she has atrial fibrillation.  Her most recent lipid profile from just a couple of weeks ago shows LDL 110, HDL 51 on simvastatin 10 mg daily.  She has normal renal function and does not have diabetes mellitus.  Hemoglobin was 12 she takes long-term iron supplements for anemia..  Thyroid function is normal  Past Medical History:  Diagnosis Date   Anxiety    Depression    GERD (gastroesophageal reflux disease)    Sleep apnea     Past Surgical History:  Procedure Laterality Date   BREAST EXCISIONAL BIOPSY Right 2019   BROW LIFT Bilateral 11/11/2021   Procedure: UPPER LID BLEPHAROPLASTY;  Surgeon: Glenna Fellows, MD;  Location: [facility];  Service: Plastics;  Laterality: Bilateral;    Current Medications: Current Meds  Medication Sig   acetaminophen (TYLENOL) 500 MG tablet Take 1,000 mg by mouth every 8 (eight) hours as needed for mild pain or headache.    alendronate (FOSAMAX) 70 MG tablet Take 70 mg by mouth once a week. Sunday   calcium carbonate (OS-CAL) 1250 (500 Ca) MG chewable tablet Chew 2 tablets by mouth daily.    dexamethasone (DECADRON) 4 MG/ML injection Apply as needed for iontophoresis   diclofenac sodium (VOLTAREN) 1 % GEL Apply 2 g topically 2 (two) times daily as needed (joint pain).    ferrous sulfate 325 (65 FE) MG tablet Take 2 tablets  by mouth once a week.   meloxicam (MOBIC) 15 MG tablet TAKE 1/2 TO 1 TABLETS BY MOUTH DAILY AS NEEDED FOR PAIN.   metoprolol tartrate (LOPRESSOR) 50 MG tablet Take 50 mg  2 hours before Coronary CT   Multiple Vitamin (MULTIVITAMIN) capsule Take 1 capsule by mouth daily.    pantoprazole (PROTONIX) 40 MG tablet Take 40 mg by mouth daily.    RESTASIS 0.05 % ophthalmic emulsion Place 1 drop into both eyes 2 (two) times daily.    sertraline (ZOLOFT) 100 MG tablet Take 100 mg by mouth daily.    simvastatin (ZOCOR) 10 MG tablet Take  10 mg by mouth daily at 6 PM.    tiZANidine (ZANAFLEX) 2 MG tablet Take 1-2 tablets (2-4 mg total) by mouth every 6 (six) hours as needed for muscle spasms.   traMADol (ULTRAM) 50 MG tablet Take 1 tablet (50 mg total) by mouth every 6 (six) hours as needed.     Allergies:   Onabotulinumtoxina, Codeine, and Sulfamethoxazole-trimethoprim   Social History   Socioeconomic History   Marital status: Married    Spouse name: Not on file   Number of children: Not on file   Years of education: Not on file   Highest education level: Not on file  Occupational History   Not on file  Tobacco Use   Smoking status: Never   Smokeless tobacco: Never  Vaping Use   Vaping Use: Never used  Substance and Sexual Activity   Alcohol use: Yes    Comment: rarely   Drug use: Never   Sexual activity: Not on file  Other Topics Concern   Not on file  Social History Narrative   Not on file   Social Determinants of Health   Financial Resource Strain: Not on file  Food Insecurity: Not on file  Transportation Needs: Not on file  Physical Activity: Not on file  Stress: Not on file  Social Connections: Not on file     Family History: The patient's family history is negative for early onset CAD  ROS:   Please see the history of present illness.     All other systems reviewed and are negative.  EKGs/Labs/Other Studies Reviewed:    The following studies were reviewed today: CT abdomen and pelvis w contrast 2021 - aortoiliac atherosclerosis w/o aneurysm  EKG:  EKG is  ordered today.  The ekg ordered today demonstrates NSR, T wave inversion V1-V4  Recent Labs: 11/29/2021: BUN 14; Creatinine, Ser 0.79; Potassium 4.6; Sodium 138  Recent Lipid Panel No results found for: "CHOL", "TRIG", "HDL", "CHOLHDL", "VLDL", "LDLCALC", "LDLDIRECT" 11/07/2021 Chol 193, HDL 51, LDL 110, TG 183   Risk Assessment/Calculations:             Physical Exam:    VS:  BP 138/72 (BP Location: Left Arm, Patient  Position: Sitting, Cuff Size: Normal)   Pulse 66   Ht 5' 3.5" (1.613 m)   Wt 68 kg   SpO2 94%   BMI 26.15 kg/m     Wt Readings from Last 3 Encounters:  11/29/21 68 kg  11/11/21 67.7 kg  10/19/19 65.8 kg     GEN: appears fit and younger than stated age Well nourished, well developed in no acute distress HEENT: Normal NECK: No JVD; No carotid bruits LYMPHATICS: No lymphadenopathy CARDIAC: RRR, no murmurs, rubs, gallops RESPIRATORY:  Clear to auscultation without rales, wheezing or rhonchi  ABDOMEN: Soft, non-tender, non-distended MUSCULOSKELETAL:  No edema; No deformity  SKIN: Warm  and dry NEUROLOGIC:  Alert and oriented x 3 PSYCHIATRIC:  Normal affect   ASSESSMENT:    1. Precordial pain   2. Atherosclerosis of aorta (HCC)   3. Mixed hyperlipidemia   4. OSA (obstructive sleep apnea)   5. Hiatal hernia    PLAN:    In order of problems listed above:  CP: could represent exertional angina. Has limited risk factors, but advanced age and evidence of atherosclerosis of the aorta. Schedule for coronary CTA. HLP: mildly elevated LDLand TG, warrant treatment if we find CAD or extensive plaque, in which case target LDL is <70. Even if there is little plaque and no coronary stenosis, would increase statin dose to at least achieve LDL<100. OSA: reports compliance w CPAP, denies hypersomnolence. GERD/large hiatal hernia: could be alternate explanation for symptoms.           Medication Adjustments/Labs and Tests Ordered: Current medicines are reviewed at length with the patient today.  Concerns regarding medicines are outlined above.  Orders Placed This Encounter  Procedures   CT CORONARY MORPH W/CTA COR W/SCORE W/CA W/CM &/OR WO/CM   Basic metabolic panel   EKG 12-Lead   Meds ordered this encounter  Medications   metoprolol tartrate (LOPRESSOR) 50 MG tablet    Sig: Take 50 mg  2 hours before Coronary CT    Dispense:  1 tablet    Refill:  0    Patient Instructions   Medication Instructions:  Continue same medciations   Lab Work: Bmet today   Testing/Procedures: Coronary Ct  will be scheduled after approved by insurance      Follow instructions below   Follow-Up: At [facility], you and your health needs are our priority.  As part of our continuing mission to provide you with exceptional heart care, we have created designated Provider Care Teams.  These Care Teams include your primary Cardiologist (physician) and Advanced Practice Providers (APPs -  Physician Assistants and Nurse Practitioners) who all work together to provide you with the care you need, when you need it.  We recommend signing up for the patient portal called "MyChart".  Sign up information is provided on this After Visit Summary.  MyChart is used to connect with patients for Virtual Visits (Telemedicine).  Patients are able to view lab/test results, encounter notes, upcoming appointments, etc.  Non-urgent messages can be sent to your provider as well.   To learn more about what you can do with MyChart, go to [email or web address].    Your next appointment:  After test    The format for your next appointment: Office   Provider: Dr.Rees Matura   [facility]      Your cardiac CT will be scheduled at one of the below locations:   [facility] [address] [place], [place] [address] [phone]  OR  [facility] [address] [place], [place] [address] [phone]  OR   [facility] [address] [place], [place] [address] [phone]  If scheduled at [facility], please arrive at the [facility] (Entrance C2) of [facility] 30 minutes prior to test start time. You can use the FREE valet parking offered at entrance C (encouraged to control the heart rate for the test)  Proceed to the [facility] Radiology Department  (first floor) to check-in and test prep.  All radiology patients and guests should use entrance C2 at [facility],  accessed from [facility], even though the hospital's physical address listed is [address].    If scheduled at [facility] or [facility], please arrive 15 mins early for check-in and test prep.   Please follow these instructions carefully (unless otherwise directed):   On the Night Before the Test: Be sure to Drink plenty of water. Do not consume any caffeinated/decaffeinated beverages or chocolate 12 hours prior to your test. Do not take any antihistamines 12 hours prior to your test.   On the Day of the Test: Drink plenty of water until 1 hour prior to the test. Do not eat any food 1 hour prior to test. You may take your regular medications prior to the test.  Take metoprolol 50 mg  two hours prior to test. HOLD Furosemide/Hydrochlorothiazide morning of the test. Hold Meloxicam morning of test. FEMALES- please wear underwire-free bra if available, avoid dresses & tight clothing        After the Test: Drink plenty of water. After receiving IV contrast, you may experience a mild flushed feeling. This is normal. On occasion, you may experience a mild rash up to 24 hours after the test. This is not dangerous. If this occurs, you can take Benadryl 25 mg and increase your fluid intake. If you experience trouble breathing, this can be serious. If it is severe call 911 IMMEDIATELY. If it is mild, please call our office. If you take any of these medications: Glipizide/Metformin, Avandament, Glucavance, please do not take 48 hours after completing test unless otherwise instructed.  We will call to schedule your test 2-4 weeks out understanding that some insurance companies will need an authorization prior to the service being performed.   For non-scheduling related questions, please contact the  cardiac imaging nurse navigator should you have any questions/concerns: Rockwell Alexandria, Cardiac Imaging Nurse Navigator Larey Brick, Cardiac Imaging Nurse Navigator [facility] Direct Office Dial: [phone]   For scheduling needs, including cancellations and rescheduling, please call [place], [phone].    Important Information About Sugar         Signed, Thurmon Fair, MD  12/01/2021 6:54 PM    [facility]

## 2021-12-01 ENCOUNTER — Encounter: Payer: Self-pay | Admitting: Cardiovascular Disease

## 2021-12-01 DIAGNOSIS — H43813 Vitreous degeneration, bilateral: Secondary | ICD-10-CM | POA: Diagnosis not present

## 2021-12-01 DIAGNOSIS — H35372 Puckering of macula, left eye: Secondary | ICD-10-CM | POA: Diagnosis not present

## 2021-12-09 ENCOUNTER — Telehealth (HOSPITAL_COMMUNITY): Payer: Self-pay | Admitting: *Deleted

## 2021-12-09 NOTE — Telephone Encounter (Signed)
Attempted to call patient regarding upcoming cardiac CT appointment. °Left message on voicemail with name and callback number ° °Yoni Lobos RN Navigator Cardiac Imaging °[facility] °[phone] Office °[phone] Cell ° °

## 2021-12-12 ENCOUNTER — Telehealth (HOSPITAL_COMMUNITY): Payer: Self-pay | Admitting: Emergency Medicine

## 2021-12-12 NOTE — Telephone Encounter (Signed)
Reaching out to patient to offer assistance regarding upcoming cardiac imaging study; pt verbalizes understanding of appt date/time, parking situation and where to check in, pre-test NPO status and medications ordered, and verified current allergies; name and call back number provided for further questions should they arise Emily Bond RN Navigator Cardiac Imaging [facility] [phone] office [phone] cell  Arrival 200  '50mg'$  metoprolol tartrate

## 2021-12-13 ENCOUNTER — Other Ambulatory Visit: Payer: Self-pay | Admitting: Cardiology

## 2021-12-13 ENCOUNTER — Ambulatory Visit (HOSPITAL_COMMUNITY)
Admission: RE | Admit: 2021-12-13 | Discharge: 2021-12-13 | Disposition: A | Discharge status: Home / Self Care | Payer: Medicare Other | Source: Ambulatory Visit | Attending: Cardiovascular Disease | Admitting: Cardiovascular Disease

## 2021-12-13 DIAGNOSIS — R072 Precordial pain: Secondary | ICD-10-CM | POA: Insufficient documentation

## 2021-12-13 DIAGNOSIS — R931 Abnormal findings on diagnostic imaging of heart and coronary circulation: Secondary | ICD-10-CM

## 2021-12-13 DIAGNOSIS — I2510 Atherosclerotic heart disease of native coronary artery without angina pectoris: Secondary | ICD-10-CM | POA: Diagnosis not present

## 2021-12-13 MED ORDER — NITROGLYCERIN 0.4 MG SL SUBL
SUBLINGUAL_TABLET | SUBLINGUAL | Status: AC
  Filled 2021-12-13: qty 2

## 2021-12-13 MED ORDER — NITROGLYCERIN 0.4 MG SL SUBL
0.8000 mg | SUBLINGUAL_TABLET | Freq: Once | SUBLINGUAL | Status: AC
  Administered 2021-12-13: 0.8 mg via SUBLINGUAL

## 2021-12-13 MED ORDER — IOHEXOL 350 MG/ML SOLN
100.0000 mL | Freq: Once | INTRAVENOUS | Status: AC | PRN
  Administered 2021-12-13: 100 mL via INTRAVENOUS

## 2021-12-14 ENCOUNTER — Ambulatory Visit (HOSPITAL_BASED_OUTPATIENT_CLINIC_OR_DEPARTMENT_OTHER)
Admission: RE | Admit: 2021-12-14 | Discharge: 2021-12-14 | Disposition: A | Discharge status: Home / Self Care | Payer: Medicare Other | Source: Ambulatory Visit | Attending: Cardiology | Admitting: Cardiology

## 2021-12-14 DIAGNOSIS — R931 Abnormal findings on diagnostic imaging of heart and coronary circulation: Secondary | ICD-10-CM | POA: Diagnosis not present

## 2021-12-14 DIAGNOSIS — R072 Precordial pain: Secondary | ICD-10-CM | POA: Diagnosis not present

## 2021-12-23 DIAGNOSIS — Z23 Encounter for immunization: Secondary | ICD-10-CM | POA: Diagnosis not present

## 2021-12-30 ENCOUNTER — Encounter: Payer: Self-pay | Admitting: Cardiovascular Disease

## 2022-01-18 ENCOUNTER — Telehealth: Payer: Self-pay | Admitting: Cardiovascular Disease

## 2022-01-18 ENCOUNTER — Ambulatory Visit: Payer: Medicare Other | Attending: Cardiovascular Disease | Admitting: Cardiovascular Disease

## 2022-01-18 ENCOUNTER — Encounter: Payer: Self-pay | Admitting: Cardiovascular Disease

## 2022-01-18 VITALS — BP 140/90 | HR 62 | Ht 63.0 in | Wt 148.2 lb

## 2022-01-18 DIAGNOSIS — I10 Essential (primary) hypertension: Secondary | ICD-10-CM | POA: Insufficient documentation

## 2022-01-18 DIAGNOSIS — R931 Abnormal findings on diagnostic imaging of heart and coronary circulation: Secondary | ICD-10-CM | POA: Insufficient documentation

## 2022-01-18 DIAGNOSIS — G4733 Obstructive sleep apnea (adult) (pediatric): Secondary | ICD-10-CM | POA: Insufficient documentation

## 2022-01-18 DIAGNOSIS — I480 Paroxysmal atrial fibrillation: Secondary | ICD-10-CM | POA: Diagnosis not present

## 2022-01-18 DIAGNOSIS — K449 Diaphragmatic hernia without obstruction or gangrene: Secondary | ICD-10-CM | POA: Diagnosis not present

## 2022-01-18 DIAGNOSIS — Z01810 Encounter for preprocedural cardiovascular examination: Secondary | ICD-10-CM | POA: Insufficient documentation

## 2022-01-18 DIAGNOSIS — D6869 Other thrombophilia: Secondary | ICD-10-CM | POA: Diagnosis not present

## 2022-01-18 DIAGNOSIS — E7800 Pure hypercholesterolemia, unspecified: Secondary | ICD-10-CM | POA: Insufficient documentation

## 2022-01-18 MED ORDER — METOPROLOL TARTRATE 50 MG PO TABS
50.0000 mg | ORAL_TABLET | Freq: Once | ORAL | Status: AC
  Administered 2022-01-18: 50 mg via ORAL

## 2022-01-18 MED ORDER — METOPROLOL SUCCINATE ER 50 MG PO TB24: 50.0000 mg | ORAL_TABLET | Freq: Every day | ORAL | 3 refills | Status: DC

## 2022-01-18 MED ORDER — APIXABAN 5 MG PO TABS: 5.0000 mg | ORAL_TABLET | Freq: Two times a day (BID) | ORAL | 3 refills | Status: DC

## 2022-01-18 NOTE — Telephone Encounter (Signed)
   Pre-operative Risk Assessment    Patient Name: Emily Bates  DOB: 26-Nov-1940 MRN: [id number]      Request for Surgical Clearance    Procedure:  vitrectomy membrane pill of left eye  Date of Surgery:  Clearance 01/25/22                                 Surgeon:  Dr. Zadie Rhine Surgeon's Group or Practice Name: [facility] Phone number:  [phone] Fax number:  [phone]   Type of Clearance Requested:   - Medical  - Pharmacy:  leaving up to cardiology    Type of Anesthesia:  MAC    Additional requests/questions:    Signed, Annabell Sabal   01/18/2022, 8:58 AM

## 2022-01-18 NOTE — Progress Notes (Signed)
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    She walked into the clinic today experiencing the same palpitations and chest discomfort that led to her original evaluation.  We found that she was in atrial fibrillation with rapid ventricular response at about 140 bpm.  After resting and receiving metoprolol tartrate 50 mg her heart rate decreased to the 90s.  She continues to report 100% compliance with CPAP and does not have daytime hypersomnolence.  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  She is known to have a large hiatal hernia, which is shown to cause some degree of lower lobe atelectasis on the CT.  She recent underwent a coronary CT angiogram that shows a coronary calcium score of 0 and only a mild (CT-FFR hemodynamically insignificant) ostial right coronary artery stenosis.  Her most recent lipid profile from just a couple of weeks ago shows LDL 110, HDL 51 on simvastatin 10 mg daily.  She has normal renal function and does not have diabetes mellitus.  Hemoglobin was 12 she takes long-term iron supplements for anemia. Thyroid function is normal  Past Medical History:  Diagnosis Date   Anxiety    Depression    GERD (gastroesophageal reflux disease)    Sleep apnea     Past Surgical History:  Procedure Laterality Date   BREAST EXCISIONAL BIOPSY Right 2019   BROW LIFT Bilateral 11/11/2021   Procedure: UPPER LID BLEPHAROPLASTY;  Surgeon: Irene Limbo, MD;  Location: [place];  Service: Plastics;  Laterality:  Bilateral;    Current Medications: Current Meds  Medication Sig   acetaminophen (TYLENOL) 500 MG tablet Take 1,000 mg by mouth every 8 (eight) hours as needed for mild pain or headache.    alendronate (FOSAMAX) 70 MG tablet Take 70 mg by mouth once a week. Sunday   apixaban (ELIQUIS) 5 MG TABS tablet Take 1 tablet (5 mg total) by mouth 2 (two) times daily.   calcium carbonate (OS-CAL) 1250 (500 Ca) MG chewable tablet Chew 2 tablets by mouth daily.    diclofenac sodium (VOLTAREN) 1 % GEL Apply 2 g topically 2 (two) times daily as needed (joint pain).    ferrous sulfate 325 (65 FE) MG tablet Take 2 tablets by mouth once a week.   fluticasone (FLONASE) 50 MCG/ACT nasal spray Place 2 sprays into both nostrils daily.   meloxicam (MOBIC) 15 MG tablet TAKE 1/2 TO 1 TABLETS BY MOUTH DAILY AS NEEDED FOR PAIN.   metoprolol succinate (TOPROL-XL) 50 MG 24 hr tablet Take 1 tablet (50 mg total) by mouth daily. Take with or immediately following a meal.   Multiple Vitamin (MULTIVITAMIN) capsule Take 1 capsule by mouth daily.    pantoprazole (PROTONIX) 40 MG tablet Take 40 mg by mouth daily.    RESTASIS 0.05 % ophthalmic emulsion Place 1 drop into both eyes 2 (two) times daily.    sertraline (ZOLOFT) 100 MG tablet Take 100 mg  by mouth daily.    simvastatin (ZOCOR) 10 MG tablet Take 10 mg by mouth daily at 6 PM.    tiZANidine (ZANAFLEX) 2 MG tablet Take 1-2 tablets (2-4 mg total) by mouth every 6 (six) hours as needed for muscle spasms.     Allergies:   Onabotulinumtoxina, Codeine, and Sulfamethoxazole-trimethoprim   Social History   Socioeconomic History   Marital status: Married    Spouse name: Not on file   Number of children: Not on file   Years of education: Not on file   Highest education level: Not on file  Occupational History   Not on file  Tobacco Use   Smoking status: Never   Smokeless tobacco: Never  Vaping Use   Vaping Use: Never used  Substance and Sexual Activity   Alcohol use:  Yes    Comment: rarely   Drug use: Never   Sexual activity: Not on file  Other Topics Concern   Not on file  Social History Narrative   Not on file   Social Determinants of Health   Financial Resource Strain: Not on file  Food Insecurity: Not on file  Transportation Needs: Not on file  Physical Activity: Not on file  Stress: Not on file  Social Connections: Not on file     Family History: The patient's family history is negative for early onset CAD  ROS:   Please see the history of present illness.    All other systems reviewed and are negative.  EKGs/Labs/Other Studies Reviewed:    The following studies were reviewed today: CT abdomen and pelvis w contrast 2021 - aortoiliac atherosclerosis w/o aneurysm  EKG:  EKG is  ordered today.  It shows atrial fibrillation with rapid ventricular response and rate related ST segment changes.  Recent Labs: 11/29/2021: BUN 14; Creatinine, Ser 0.79; Potassium 4.6; Sodium 138  Recent Lipid Panel No results found for: "CHOL", "TRIG", "HDL", "CHOLHDL", "VLDL", "LDLCALC", "LDLDIRECT" 11/07/2021 Chol 193, HDL 51, LDL 110, TG 183   Risk Assessment/Calculations:      HYPERTENSION CONTROL Vitals:   01/18/22 1456 01/18/22 1545  BP: (!) 144/86 (!) 140/90    The patient's blood pressure is elevated above target today.  In order to address the patient's elevated BP: A new medication was prescribed today.            Physical Exam:    VS:  BP (!) 140/90   Pulse 62 Comment: post metoprolol tartrate  Ht '5\' 3"'$  (1.6 m)   Wt 148 lb 3.2 oz (67.2 kg)   BMI 26.25 kg/m     Wt Readings from Last 3 Encounters:  01/18/22 148 lb 3.2 oz (67.2 kg)  11/29/21 150 lb (68 kg)  11/11/21 149 lb 4 oz (67.7 kg)     General: Alert, oriented x3, no distress,  Head: no evidence of trauma, PERRL, EOMI, no exophtalmos or lid lag, no myxedema, no xanthelasma; normal ears, nose and oropharynx Neck: normal jugular venous pulsations and no  hepatojugular reflux; brisk carotid pulses without delay and no carotid bruits Chest: clear to auscultation, no signs of consolidation by percussion or palpation, normal fremitus, symmetrical and full respiratory excursions Cardiovascular: normal position and quality of the apical impulse, rapid irregular rhythm, normal first and second heart sounds, no murmurs, rubs or gallops Abdomen: no tenderness or distention, no masses by palpation, no abnormal pulsatility or arterial bruits, normal bowel sounds, no hepatosplenomegaly Extremities: no clubbing, cyanosis or edema; 2+ radial, ulnar and brachial pulses bilaterally;  2+ right femoral, posterior tibial and dorsalis pedis pulses; 2+ left femoral, posterior tibial and dorsalis pedis pulses; no subclavian or femoral bruits Neurological: grossly nonfocal Psych: Normal mood and affect   ASSESSMENT:    1. Paroxysmal atrial fibrillation (HCC)   2. Acquired thrombophilia ([place])   3. Abnormal findings diagnostic imaging of heart and coronary circulation   4. Hypercholesterolemia   5. Essential hypertension   6. OSA (obstructive sleep apnea)   7. Hiatal hernia   8. Preoperative cardiovascular examination     PLAN:    In order of problems listed above:  AFib: Became clear after today's appointment that her symptoms have been related to repeated episodes of atrial fibrillation rapid ventricular response.  Does not have significant CAD by CT angiography.  Symptoms subsided with rate control for atrial fibrillation.  Metoprolol tartrate 50 mg was given in the office today.  Will start metoprolol succinate 50 mg once daily.  At this point there does not appear to be any reason to start true antiarrhythmics or refer for ablation.  CHA2DS2-VASc score 4 (age 60, gender, hypertension).  We discussed the pros and cons of anticoagulation for stroke prevention.   Anticoagulation: Will start Eliquis 5 mg twice daily, but have decided to delay initiation until after  she has her upcoming ophthalmological procedure with Dr. Zadie Rhine.  That she needs to use meloxicam and other NSAIDs very sparingly.  Prefer use of acetaminophen.  Right now she is only taking a tablet of meloxicam about once every 2 weeks.  She is on chronic iron supplements.  Recheck hemoglobin level at her next appointment.  If GI bleeding related anemia becomes an issue, may need to consider Watchman device. CP: Occurs during the episodes of atrial fibrillation with rapid ventricular response, relieved with rate control.  She has minimal underlying CAD.  Calcium score 0. HLP: The amount of coronary plaque is less than expected for her age.  With calcium score of 0 there is no reason to intensify her lipid-lowering therapy. HTN: Even outside of today's event her blood pressure has been at least mildly elevated.  Metoprolol should serve for blood pressure control as well. OSA: reports compliance w CPAP, denies hypersomnolence.  Recommend continued CPAP use. GERD/large hiatal hernia: Some lower lung atelectasis seen on CT, may explain some of her dyspnea, but does not warrant surgery in my opinion. Preop CV exam: she is scheduled for retinal surgery with Dr. Zadie Rhine this week.  I think it is best to wait until after that surgery to initiate treatment with Eliquis.           Medication Adjustments/Labs and Tests Ordered: Current medicines are reviewed at length with the patient today.  Concerns regarding medicines are outlined above.  No orders of the defined types were placed in this encounter.  Meds ordered this encounter  Medications   metoprolol tartrate (LOPRESSOR) tablet 50 mg    Lot#: [id number] Exp: 05/2022   metoprolol succinate (TOPROL-XL) 50 MG 24 hr tablet    Sig: Take 1 tablet (50 mg total) by mouth daily. Take with or immediately following a meal.    Dispense:  90 tablet    Refill:  3   apixaban (ELIQUIS) 5 MG TABS tablet    Sig: Take 1 tablet (5 mg total) by mouth 2 (two) times  daily.    Dispense:  180 tablet    Refill:  3    Patient Instructions  Medication Instructions:  Your physician has recommended you make  the following change in your medication:   -Start taking metoprolol succinate (Toprol-XL) '50mg'$  once daily.  -Start taking apixaban (Eliquis) '5mg'$  twice daily.  *If you need a refill on your cardiac medications before your next appointment, please call your pharmacy*    Follow-Up: At [facility], you and your health needs are our priority.  As part of our continuing mission to provide you with exceptional heart care, we have created designated Provider Care Teams.  These Care Teams include your primary Cardiologist (physician) and Advanced Practice Providers (APPs -  Physician Assistants and Nurse Practitioners) who all work together to provide you with the care you need, when you need it.  We recommend signing up for the patient portal called "MyChart".  Sign up information is provided on this After Visit Summary.  MyChart is used to connect with patients for Virtual Visits (Telemedicine).  Patients are able to view lab/test results, encounter notes, upcoming appointments, etc.  Non-urgent messages can be sent to your provider as well.   To learn more about what you can do with MyChart, go to [email or web address].    Your next appointment:   2-3 month(s)  The format for your next appointment:   In Person  Provider:   Sanda Klein, MD   Signed, Sanda Klein, MD  01/18/2022 3:52 PM    [place]

## 2022-01-18 NOTE — Patient Instructions (Addendum)
Medication Instructions:  Your physician has recommended you make the following change in your medication:   -Start taking metoprolol succinate (Toprol-XL) '50mg'$  once daily.  -Start taking apixaban (Eliquis) '5mg'$  twice daily.  *If you need a refill on your cardiac medications before your next appointment, please call your pharmacy*    Follow-Up: At [facility], you and your health needs are our priority.  As part of our continuing mission to provide you with exceptional heart care, we have created designated Provider Care Teams.  These Care Teams include your primary Cardiologist (physician) and Advanced Practice Providers (APPs -  Physician Assistants and Nurse Practitioners) who all work together to provide you with the care you need, when you need it.  We recommend signing up for the patient portal called "MyChart".  Sign up information is provided on this After Visit Summary.  MyChart is used to connect with patients for Virtual Visits (Telemedicine).  Patients are able to view lab/test results, encounter notes, upcoming appointments, etc.  Non-urgent messages can be sent to your provider as well.   To learn more about what you can do with MyChart, go to [email or web address].    Your next appointment:   2-3 month(s)  The format for your next appointment:   In Person  Provider:   Sanda Klein, MD

## 2022-01-23 DIAGNOSIS — H35372 Puckering of macula, left eye: Secondary | ICD-10-CM | POA: Diagnosis not present

## 2022-01-23 NOTE — Telephone Encounter (Signed)
   Patient Name: Emily Bates  DOB: 02/27/1940 MRN: [id number]  Primary Cardiologist: None  Chart reviewed as part of pre-operative protocol coverage. Pre-op clearance already addressed by colleagues in earlier phone notes. To summarize recommendations:  -Preop CV exam: she is scheduled for retinal surgery with Dr. Zadie Rhine this week.  I think it is best to wait until after that surgery to initiate treatment with Eliquis. -Dr. Sallyanne Kuster  Will route this bundled recommendation to requesting provider via Epic fax function and remove from pre-op pool. Please call with questions.  Elgie Collard, PA-C 01/23/2022, 1:38 PM

## 2022-01-23 NOTE — Telephone Encounter (Signed)
Emily Bates from [facility] calling for preop clearance form.

## 2022-01-23 NOTE — Telephone Encounter (Signed)
Patient has not yet started Eliquis. Per Dr. Loletha Grayer note on 1/3:  "Anticoagulation: Will start Eliquis 5 mg twice daily, but have decided to delay initiation until after she has her upcoming ophthalmological procedure with Dr. Zadie Rhine.  That she needs to use meloxicam and other NSAIDs very sparingly.  Prefer use of acetaminophen.  Right now she is only taking a tablet of meloxicam about once every 2 weeks.  She is on chronic iron supplements.  Recheck hemoglobin level at her next appointment.  If GI bleeding related anemia becomes an issue, may need to consider Watchman device."

## 2022-01-24 NOTE — Addendum Note (Signed)
Addended by: Jacqulynn Cadet on: 01/24/2022 10:47 AM   Modules accepted: Orders

## 2022-01-25 DIAGNOSIS — H33322 Round hole, left eye: Secondary | ICD-10-CM | POA: Diagnosis not present

## 2022-01-25 DIAGNOSIS — H35372 Puckering of macula, left eye: Secondary | ICD-10-CM | POA: Diagnosis not present

## 2022-01-26 DIAGNOSIS — H35372 Puckering of macula, left eye: Secondary | ICD-10-CM | POA: Diagnosis not present

## 2022-02-06 ENCOUNTER — Encounter: Payer: Self-pay | Admitting: Cardiovascular Disease

## 2022-02-06 DIAGNOSIS — H35372 Puckering of macula, left eye: Secondary | ICD-10-CM | POA: Diagnosis not present

## 2022-02-16 ENCOUNTER — Telehealth: Payer: Self-pay | Admitting: *Deleted

## 2022-02-16 NOTE — Telephone Encounter (Signed)
Patient came by the office stating she would like to start the fast acting metoprolol tartrate in which Dr Sallyanne Kuster message her about on 02/07/22.     02/07/22  4:41 PM It is fine to take the metoprolol as need - that medication should be tapered off gradually. If you wish to take the metoprolol "as needed" only, I recommend switching to the metoprolol tartrate formulation which is quicker in onset and shorter in duration (take 25 mg once or twice daily as needed). The metoprolol succinate is too slow. On the other hand, I strongly recommend continuing the Eliquis. Its purpose is to protect you from stroke. It will not work if taken as "needed". We are unable to predict the episodes of atrial fibrillation, you may be unaware of some of the episodes of atrial fibrillation and the embolism and stroke can occur days later, outside of the actual period of atrial fibrillation. Not taking an anticoagulant increase your risk of stroke 5-fold. Eliquis is also unlikely to be the cause of the side effects you have recently experienced (the only likely side effect is worsened bleeding).  Croitoru, Mihai, MD    RN will  defer to Dr Sallyanne Kuster to see how he would like the patient wean off Metoprolol succinate   Will contact patient with information

## 2022-02-16 NOTE — Telephone Encounter (Signed)
Please take metoprolol succinate 25 mg (half of 50 mg tab) once daily for 4 days, then stop it and take metoprolol tartrate "as needed" once or twice daily for rapid atrial fibrillation.

## 2022-02-17 MED ORDER — METOPROLOL TARTRATE 25 MG PO TABS: ORAL_TABLET | ORAL | 6 refills | Status: DC

## 2022-02-17 NOTE — Telephone Encounter (Signed)
Called spoke to  patient. Patient states she has not been taking Metoprolol succinate for at least 2 weeks or more.  Patient wanted  metoprolol tartrate sent to pharmacy

## 2022-03-20 DIAGNOSIS — H3342 Traction detachment of retina, left eye: Secondary | ICD-10-CM | POA: Diagnosis not present

## 2022-03-22 DIAGNOSIS — H3342 Traction detachment of retina, left eye: Secondary | ICD-10-CM | POA: Diagnosis not present

## 2022-03-22 DIAGNOSIS — H33022 Retinal detachment with multiple breaks, left eye: Secondary | ICD-10-CM | POA: Diagnosis not present

## 2022-03-22 DIAGNOSIS — H3522 Other non-diabetic proliferative retinopathy, left eye: Secondary | ICD-10-CM | POA: Diagnosis not present

## 2022-03-30 DIAGNOSIS — H43811 Vitreous degeneration, right eye: Secondary | ICD-10-CM | POA: Diagnosis not present

## 2022-03-30 DIAGNOSIS — H35372 Puckering of macula, left eye: Secondary | ICD-10-CM | POA: Diagnosis not present

## 2022-03-30 DIAGNOSIS — H3342 Traction detachment of retina, left eye: Secondary | ICD-10-CM | POA: Diagnosis not present

## 2022-04-12 DIAGNOSIS — G4733 Obstructive sleep apnea (adult) (pediatric): Secondary | ICD-10-CM | POA: Diagnosis not present

## 2022-04-20 DIAGNOSIS — H35372 Puckering of macula, left eye: Secondary | ICD-10-CM | POA: Diagnosis not present

## 2022-04-20 DIAGNOSIS — H01116 Allergic dermatitis of left eye, unspecified eyelid: Secondary | ICD-10-CM | POA: Diagnosis not present

## 2022-04-20 DIAGNOSIS — H43811 Vitreous degeneration, right eye: Secondary | ICD-10-CM | POA: Diagnosis not present

## 2022-04-20 DIAGNOSIS — H3342 Traction detachment of retina, left eye: Secondary | ICD-10-CM | POA: Diagnosis not present

## 2022-05-01 ENCOUNTER — Encounter: Payer: Self-pay | Admitting: Cardiovascular Disease

## 2022-05-01 ENCOUNTER — Ambulatory Visit: Payer: Medicare Other | Attending: Cardiovascular Disease | Admitting: Cardiovascular Disease

## 2022-05-01 ENCOUNTER — Ambulatory Visit (INDEPENDENT_AMBULATORY_CARE_PROVIDER_SITE_OTHER): Payer: Medicare Other

## 2022-05-01 VITALS — BP 148/62 | HR 91 | Ht 63.0 in | Wt 150.4 lb

## 2022-05-01 DIAGNOSIS — I480 Paroxysmal atrial fibrillation: Secondary | ICD-10-CM | POA: Insufficient documentation

## 2022-05-01 DIAGNOSIS — I700 Atherosclerosis of aorta: Secondary | ICD-10-CM | POA: Diagnosis not present

## 2022-05-01 DIAGNOSIS — K449 Diaphragmatic hernia without obstruction or gangrene: Secondary | ICD-10-CM | POA: Insufficient documentation

## 2022-05-01 DIAGNOSIS — D6869 Other thrombophilia: Secondary | ICD-10-CM | POA: Diagnosis not present

## 2022-05-01 DIAGNOSIS — E7800 Pure hypercholesterolemia, unspecified: Secondary | ICD-10-CM | POA: Diagnosis not present

## 2022-05-01 DIAGNOSIS — I10 Essential (primary) hypertension: Secondary | ICD-10-CM | POA: Diagnosis not present

## 2022-05-01 DIAGNOSIS — R0600 Dyspnea, unspecified: Secondary | ICD-10-CM | POA: Insufficient documentation

## 2022-05-01 DIAGNOSIS — G4733 Obstructive sleep apnea (adult) (pediatric): Secondary | ICD-10-CM | POA: Diagnosis not present

## 2022-05-01 DIAGNOSIS — I5032 Chronic diastolic (congestive) heart failure: Secondary | ICD-10-CM | POA: Insufficient documentation

## 2022-05-01 DIAGNOSIS — I25118 Atherosclerotic heart disease of native coronary artery with other forms of angina pectoris: Secondary | ICD-10-CM | POA: Diagnosis not present

## 2022-05-01 DIAGNOSIS — K219 Gastro-esophageal reflux disease without esophagitis: Secondary | ICD-10-CM | POA: Insufficient documentation

## 2022-05-01 NOTE — Progress Notes (Signed)
Cardiology Office Note:    Date:  05/07/2022   ID:  Emily Bates, DOB 1940/02/07, MRN [id number]  PCP:  Thana Ates, MD   [facility] Providers Cardiologist:  None     Referring MD: Thana Ates, MD   Chief Complaint  Patient presents with   Atrial Fibrillation    History of Present Illness:    Emily Bates is a 82 y.o. female retired Scientist, clinical (histocompatibility and immunogenetics) with recently documented paroxysmal atrial fibrillation, with a longer history of palpitations), treated sleep apnea on CPAP, GERD, but otherwise very good health.    She has not had any further problems with chest pain and her palpitations generally last less than 10 seconds at a time.  She had a problem with a retinal detachment and is seeing a retina specialist, but the problem began before initiation of the anticoagulant.    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  She does not have any orthopnea, PND or lower extremity edema and denies chest pain with activity.  She has not had dizziness or syncope.  Denies any focal neurological events, falls, serious injuries or bleeding.  She is compliant with CPAP and denies daytime hypersomnolence.  She has a large hiatal hernia with some degree of left lower lobe atelectasis.  Her coronary CT does not show any evidence of meaningful coronary stenoses (mild ostial RCA plaque, not significant by CT-FFR) and she had a calcium score of 0.  She does not have diabetes mellitus and does not smoke.  Her most recent LDL cholesterol was 110.  She has normal thyroid function.  Her blood pressure was a little high today initially at 148/62, but upon rechecking it was 130/72.   Past Medical History:  Diagnosis Date   Anxiety    Depression    Essential hypertension 05/07/2022   GERD (gastroesophageal reflux disease)    Sleep apnea     Past Surgical History:  Procedure Laterality Date   BREAST EXCISIONAL BIOPSY  Right 2019   BROW LIFT Bilateral 11/11/2021   Procedure: UPPER LID BLEPHAROPLASTY;  Surgeon: Glenna Fellows, MD;  Location: [facility];  Service: Plastics;  Laterality: Bilateral;    Current Medications: Current Meds  Medication Sig   acetaminophen (TYLENOL) 500 MG tablet Take 1,000 mg by mouth every 8 (eight) hours as needed for mild pain or headache.    alendronate (FOSAMAX) 70 MG tablet Take 70 mg by mouth once a week. Sunday   calcium carbonate (OS-CAL) 1250 (500 Ca) MG chewable tablet Chew 2 tablets by mouth daily.    diclofenac sodium (VOLTAREN) 1 % GEL Apply 2 g topically 2 (two) times daily as needed (joint pain).    ferrous sulfate 325 (65 FE) MG tablet Take 2 tablets by mouth once a week.   fluticasone (FLONASE) 50 MCG/ACT nasal spray Place 2 sprays into both nostrils daily.   meloxicam (MOBIC) 15 MG tablet TAKE 1/2 TO 1 TABLETS BY MOUTH DAILY AS NEEDED FOR PAIN.   Multiple Vitamin (MULTIVITAMIN) capsule Take 1 capsule by mouth daily.    pantoprazole (PROTONIX) 40 MG tablet Take 40 mg by mouth daily.    RESTASIS 0.05 % ophthalmic emulsion Place 1 drop into both eyes 2 (two) times daily.    sertraline (ZOLOFT) 100 MG tablet Take 100 mg by mouth daily.    simvastatin (ZOCOR) 10 MG tablet Take 10 mg by mouth daily at 6 PM.  tiZANidine (ZANAFLEX) 2 MG tablet Take 1-2 tablets (2-4 mg total) by mouth every 6 (six) hours as needed for muscle spasms.     Allergies:   Onabotulinumtoxina, Codeine, and Sulfamethoxazole-trimethoprim   Social History   Socioeconomic History   Marital status: Married    Spouse name: Not on file   Number of children: Not on file   Years of education: Not on file   Highest education level: Not on file  Occupational History   Not on file  Tobacco Use   Smoking status: Never   Smokeless tobacco: Never  Vaping Use   Vaping Use: Never used  Substance and Sexual Activity   Alcohol use: Yes    Comment: rarely   Drug use: Never    Sexual activity: Not on file  Other Topics Concern   Not on file  Social History Narrative   Not on file   Social Determinants of Health   Financial Resource Strain: Not on file  Food Insecurity: Not on file  Transportation Needs: Not on file  Physical Activity: Not on file  Stress: Not on file  Social Connections: Not on file     Family History: The patient's family history is negative for early onset CAD  ROS:   Please see the history of present illness.    All other systems reviewed and are negative.  EKGs/Labs/Other Studies Reviewed:    The following studies were reviewed today: Coronary CTA 12/13/2021:  1. Large hiatal hernia with associated relaxation atelectasis in the bilateral paramedian lower lobes. 2.  Aortic Atherosclerosis (ICD10-I70.0).  1. Coronary calcium score of 0. 2. Normal coronary origin with right dominance. 3. Noncalcified plaque in ostial RCA causes moderate (~50%) stenosis. CTFFR 0.97 across lesion in ostial RCA, suggesting lesion is not functionally significant  Echocardiogram 05/03/2022:    1. Left ventricular ejection fraction, by estimation, is 60 to 65%. The  left ventricle has normal function. The left ventricle has no regional  wall motion abnormalities. Left ventricular diastolic parameters are  consistent with Grade II diastolic  dysfunction (pseudonormalization). The average left ventricular global  longitudinal strain is -22.3 %. The global longitudinal strain is normal.   2. Right ventricular systolic function is normal. The right ventricular  size is mildly enlarged. There is normal pulmonary artery systolic  pressure. The estimated right ventricular systolic pressure is 31.3 mmHg.   3. Left atrial size was mildly dilated.   4. Right atrial size was mildly dilated.   5. The mitral valve is normal in structure. Mild mitral valve  regurgitation. No evidence of mitral stenosis.   6. The aortic valve is tricuspid. There is mild  calcification of the  aortic valve. Aortic valve regurgitation is not visualized. No aortic  stenosis is present.   7. The inferior vena cava is normal in size with greater than 50%  respiratory variability, suggesting right atrial pressure of 3 mmHg.   LV e' medial:    5.03 cm/s  LV E/e' medial:  22.3  LV e' lateral:   4.22 cm/s  LV E/e' lateral: 26.5   MV Decel Time: 225 msec      MV E velocity: 112.00 cm/s  MV A velocity: 89.70 cm/s  MV E/A ratio:  1.25          RV S prime:     14.00 cm/s  TR Peak grad:   28.3 mmHg    EKG:  EKG is not ordered today.  Tracing from 01/18/2022 shows atrial fibrillation  with rapid ventricular response and rate related ST segment changes.  Recent Labs: 11/29/2021: BUN 14; Creatinine, Ser 0.79; Potassium 4.6; Sodium 138  Recent Lipid Panel No results found for: "CHOL", "TRIG", "HDL", "CHOLHDL", "VLDL", "LDLCALC", "LDLDIRECT" 11/07/2021 Chol 193, HDL 51, LDL 110, TG 183   Risk Assessment/Calculations:      Recheck BP was 130/72      Physical Exam:    VS:  BP (!) 148/62 (BP Location: Left Arm, Patient Position: Sitting, Cuff Size: Normal)   Pulse 91   Ht 5\' 3"  (1.6 m)   Wt 150 lb 6.4 oz (68.2 kg)   SpO2 94%   BMI 26.64 kg/m     Wt Readings from Last 3 Encounters:  05/01/22 150 lb 6.4 oz (68.2 kg)  01/18/22 148 lb 3.2 oz (67.2 kg)  11/29/21 150 lb (68 kg)      General: Alert, oriented x3, no distress, appears younger than stated age and fit Head: no evidence of trauma, PERRL, EOMI, no exophtalmos or lid lag, no myxedema, no xanthelasma; normal ears, nose and oropharynx Neck: normal jugular venous pulsations and no hepatojugular reflux; brisk carotid pulses without delay and no carotid bruits Chest: clear to auscultation, no signs of consolidation by percussion or palpation, normal fremitus, symmetrical and full respiratory excursions Cardiovascular: normal position and quality of the apical impulse, regular rhythm, normal first and  second heart sounds, no murmurs, rubs or gallops Abdomen: no tenderness or distention, no masses by palpation, no abnormal pulsatility or arterial bruits, normal bowel sounds, no hepatosplenomegaly Extremities: no clubbing, cyanosis or edema; 2+ radial, ulnar and brachial pulses bilaterally; 2+ right femoral, posterior tibial and dorsalis pedis pulses; 2+ left femoral, posterior tibial and dorsalis pedis pulses; no subclavian or femoral bruits Neurological: grossly nonfocal Psych: Normal mood and affect    ASSESSMENT:    1. Paroxysmal atrial fibrillation   2. Acquired thrombophilia   3. Coronary artery disease involving native coronary artery of native heart with other form of angina pectoris   4. Atherosclerosis of aorta   5. Hypercholesterolemia   6. Essential hypertension   7. OSA (obstructive sleep apnea)   8. Hiatal hernia   9. Gastroesophageal reflux disease, unspecified whether esophagitis present   10. Chronic diastolic heart failure   11. Dyspnea, unspecified type      PLAN:    In order of problems listed above:  AFib: Previously her episodes were associated with chest pain, before we started beta-blockers.  Currently unaware of palpitations except for very brief events.  I wonder whether she is having more frequent A-fib that may be responsible for her shortness of breath.  Will check a 14-day monitor.  If the burden of arrhythmia is high could consider antiarrhythmics or referral for ablation.  CHA2DS2-VASc score 4 (age 5, gender, hypertension).  Chest pain occurred during episodes of A-fib with RVR and was relieved with rate control. Anticoagulation: She expresses a great deal of reluctance about continuing anticoagulation lifelong.  We did discuss the option for a Watchman device.  She does have history of iron deficiency anemia.  She does not find that option feeling either.  For the time being she will take Eliquis.   CAD: CT angiography shows that she has minimal  underlying CAD.  Calcium score 0.  She does have some aortic atherosclerosis. HLP: The amount of coronary plaque is less than expected for her age, although some aortic atherosclerosis is also present.  With calcium score of 0 there is no reason  to intensify her lipid-lowering therapy. HTN: Fair control of blood pressure with beta-blockers. OSA: reports compliance w CPAP, denies hypersomnolence.  Recommend continued CPAP use. GERD/large hiatal hernia: This is causing some lung atelectasis and could be responsible for some of her dyspnea.  ADDENDUM: The echocardiogram obtained after this appointment suggest that she has diastolic dysfunction and elevated mean left atrial pressure consistent with diastolic heart failure.  Could benefit from diuretic therapy for symptom relief.  She is generally very reluctant to take medications.  Will try to focus on a sodium restricted diet.            Medication Adjustments/Labs and Tests Ordered: Current medicines are reviewed at length with the patient today.  Concerns regarding medicines are outlined above.  Orders Placed This Encounter  Procedures   LONG TERM MONITOR (3-14 DAYS)   ECHOCARDIOGRAM COMPLETE   No orders of the defined types were placed in this encounter.   Patient Instructions  Medication Instructions:  No changes *If you need a refill on your cardiac medications before your next appointment, please call your pharmacy*  Testing/Procedures:  Your physician has requested that you have an echocardiogram. Echocardiography is a painless test that uses sound waves to create images of your heart. It provides your doctor with information about the size and shape of your heart and how well your heart's chambers and valves are working. This procedure takes approximately one hour. There are no restrictions for this procedure. Please do NOT wear cologne, perfume, aftershave, or lotions (deodorant is allowed). Please arrive 15 minutes prior to  your appointment time.    Your physician has recommended that you wear a 14 DAY ZIO-PATCH monitor. The Zio patch cardiac monitor continuously records heart rhythm data for up to 14 days, this is for patients being evaluated for multiple types heart rhythms. For the first 24 hours post application, please avoid getting the Zio monitor wet in the shower or by excessive sweating during exercise. After that, feel free to carry on with regular activities. Keep soaps and lotions away from the ZIO XT Patch.  This will be mailed to you, please expect 7-10 days to receive.    Applying the monitor   Shave hair from upper left chest.   Hold abrader disc by orange tab.  Rub abrader in 40 strokes over left upper chest as indicated in your monitor instructions.   Clean area with 4 enclosed alcohol pads .  Use all pads to assure are is cleaned thoroughly.  Let dry.   Apply patch as indicated in monitor instructions.  Patch will be place under collarbone on left side of chest with arrow pointing upward.   Rub patch adhesive wings for 2 minutes.Remove white label marked "1".  Remove white label marked "2".  Rub patch adhesive wings for 2 additional minutes.   While looking in a mirror, press and release button in center of patch.  A small green light will flash 3-4 times .  This will be your only indicator the monitor has been turned on.     Do not shower for the first 24 hours.  You may shower after the first 24 hours.   Press button if you feel a symptom. You will hear a small click.  Record Date, Time and Symptom in the Patient Log Book.   When you are ready to remove patch, follow instructions on last 2 pages of Patient Log Book.  Stick patch monitor onto last page of Patient Log Book.  Place Patient Log Book in [place] box.  Use locking tab on box and tape box closed securely.  The Orange and [employer] has [employer] on it.  Please place in mailbox as soon as possible.  Your physician should have  your test results approximately 7 days after the monitor has been mailed back to [facility].   Call [facility] Customer Care at [phone] if you have questions regarding your ZIO XT patch monitor.  Call them immediately if you see an orange light blinking on your monitor.   If your monitor falls off in less than 4 days contact our Monitor department at [phone].  If your monitor becomes loose or falls off after 4 days call [employer] at [phone] for suggestions on securing your monitor    Follow-Up: At [facility], you and your health needs are our priority.  As part of our continuing mission to provide you with exceptional heart care, we have created designated Provider Care Teams.  These Care Teams include your primary Cardiologist (physician) and Advanced Practice Providers (APPs -  Physician Assistants and Nurse Practitioners) who all work together to provide you with the care you need, when you need it.  We recommend signing up for the patient portal called "MyChart".  Sign up information is provided on this After Visit Summary.  MyChart is used to connect with patients for Virtual Visits (Telemedicine).  Patients are able to view lab/test results, encounter notes, upcoming appointments, etc.  Non-urgent messages can be sent to your provider as well.   To learn more about what you can do with MyChart, go to [email or web address].    Your next appointment:   4 month(s)  Provider:   Dr Royann Shivers     Signed, Thurmon Fair, MD  05/07/2022 2:56 PM     [facility]

## 2022-05-01 NOTE — Progress Notes (Unsigned)
Enrolled for [employer] to mail a ZIO XT long term holter monitor to the patients address on file.  

## 2022-05-01 NOTE — Patient Instructions (Addendum)
Medication Instructions:  No changes *If you need a refill on your cardiac medications before your next appointment, please call your pharmacy*  Testing/Procedures:  Your physician has requested that you have an echocardiogram. Echocardiography is a painless test that uses sound waves to create images of your heart. It provides your doctor with information about the size and shape of your heart and how well your heart's chambers and valves are working. This procedure takes approximately one hour. There are no restrictions for this procedure. Please do NOT wear cologne, perfume, aftershave, or lotions (deodorant is allowed). Please arrive 15 minutes prior to your appointment time.    Your physician has recommended that you wear a 14 DAY ZIO-PATCH monitor. The Zio patch cardiac monitor continuously records heart rhythm data for up to 14 days, this is for patients being evaluated for multiple types heart rhythms. For the first 24 hours post application, please avoid getting the Zio monitor wet in the shower or by excessive sweating during exercise. After that, feel free to carry on with regular activities. Keep soaps and lotions away from the ZIO XT Patch.  This will be mailed to you, please expect 7-10 days to receive.    Applying the monitor   Shave hair from upper left chest.   Hold abrader disc by orange tab.  Rub abrader in 40 strokes over left upper chest as indicated in your monitor instructions.   Clean area with 4 enclosed alcohol pads .  Use all pads to assure are is cleaned thoroughly.  Let dry.   Apply patch as indicated in monitor instructions.  Patch will be place under collarbone on left side of chest with arrow pointing upward.   Rub patch adhesive wings for 2 minutes.Remove white label marked "1".  Remove white label marked "2".  Rub patch adhesive wings for 2 additional minutes.   While looking in a mirror, press and release button in center of patch.  A small green light  will flash 3-4 times .  This will be your only indicator the monitor has been turned on.     Do not shower for the first 24 hours.  You may shower after the first 24 hours.   Press button if you feel a symptom. You will hear a small click.  Record Date, Time and Symptom in the Patient Log Book.   When you are ready to remove patch, follow instructions on last 2 pages of Patient Log Book.  Stick patch monitor onto last page of Patient Log Book.   Place Patient Log Book in [place] box.  Use locking tab on box and tape box closed securely.  The [employer] and [employer] has [employer] on it.  Please place in mailbox as soon as possible.  Your physician should have your test results approximately 7 days after the monitor has been mailed back to [facility].   Call [facility] Customer Care at [phone] if you have questions regarding your ZIO XT patch monitor.  Call them immediately if you see an orange light blinking on your monitor.   If your monitor falls off in less than 4 days contact our Monitor department at [phone].  If your monitor becomes loose or falls off after 4 days call [employer] at [phone] for suggestions on securing your monitor    Follow-Up: At [facility], you and your health needs are our priority.  As part of our continuing mission to provide you with exceptional heart care, we have created  designated Provider Care Teams.  These Care Teams include your primary Cardiologist (physician) and Advanced Practice Providers (APPs -  Physician Assistants and Nurse Practitioners) who all work together to provide you with the care you need, when you need it.  We recommend signing up for the patient portal called "MyChart".  Sign up information is provided on this After Visit Summary.  MyChart is used to connect with patients for Virtual Visits (Telemedicine).  Patients are able to view lab/test results, encounter notes, upcoming appointments, etc.  Non-urgent  messages can be sent to your provider as well.   To learn more about what you can do with MyChart, go to [email or web address].    Your next appointment:   4 month(s)  Provider:   Dr Royann Shivers

## 2022-05-02 ENCOUNTER — Telehealth: Payer: Self-pay | Admitting: Emergency Medicine

## 2022-05-02 NOTE — Telephone Encounter (Signed)
Called pt and let her know that Dr Royann Shivers would like her to get an echocardiogram. Scheduling will call her to set up 4 month follow up appt with Dr Royann Shivers and the Echocardiogram. She verbalized understanding.

## 2022-05-03 ENCOUNTER — Ambulatory Visit (HOSPITAL_COMMUNITY): Payer: Medicare Other | Attending: Cardiovascular Disease

## 2022-05-03 DIAGNOSIS — R0600 Dyspnea, unspecified: Secondary | ICD-10-CM

## 2022-05-03 LAB — ECHOCARDIOGRAM COMPLETE
Area-P 1/2: 3.37 cm2
S' Lateral: 2.4 cm

## 2022-05-05 DIAGNOSIS — I480 Paroxysmal atrial fibrillation: Secondary | ICD-10-CM

## 2022-05-07 ENCOUNTER — Encounter: Payer: Self-pay | Admitting: Cardiovascular Disease

## 2022-05-07 DIAGNOSIS — I10 Essential (primary) hypertension: Secondary | ICD-10-CM

## 2022-05-07 DIAGNOSIS — K219 Gastro-esophageal reflux disease without esophagitis: Secondary | ICD-10-CM | POA: Insufficient documentation

## 2022-05-07 DIAGNOSIS — G4733 Obstructive sleep apnea (adult) (pediatric): Secondary | ICD-10-CM | POA: Insufficient documentation

## 2022-05-07 DIAGNOSIS — I480 Paroxysmal atrial fibrillation: Secondary | ICD-10-CM | POA: Insufficient documentation

## 2022-05-07 DIAGNOSIS — I5032 Chronic diastolic (congestive) heart failure: Secondary | ICD-10-CM | POA: Insufficient documentation

## 2022-05-07 DIAGNOSIS — E7800 Pure hypercholesterolemia, unspecified: Secondary | ICD-10-CM | POA: Insufficient documentation

## 2022-05-07 DIAGNOSIS — I700 Atherosclerosis of aorta: Secondary | ICD-10-CM | POA: Insufficient documentation

## 2022-05-07 HISTORY — DX: Essential (primary) hypertension: I10

## 2022-05-22 DIAGNOSIS — H35352 Cystoid macular degeneration, left eye: Secondary | ICD-10-CM | POA: Diagnosis not present

## 2022-05-22 DIAGNOSIS — H35372 Puckering of macula, left eye: Secondary | ICD-10-CM | POA: Diagnosis not present

## 2022-05-30 DIAGNOSIS — I480 Paroxysmal atrial fibrillation: Secondary | ICD-10-CM | POA: Diagnosis not present

## 2022-06-01 ENCOUNTER — Telehealth: Payer: Self-pay | Admitting: Cardiovascular Disease

## 2022-06-01 NOTE — Telephone Encounter (Signed)
  Per MyChart scheduling message:   Pt c/o Shortness Of Breath: STAT if SOB developed within the last 24 hours or pt is noticeably SOB on the phone  1. Are you currently SOB (can you hear that pt is SOB on the phone)?   2. How long have you been experiencing SOB?   3. Are you SOB when sitting or when up moving around?   4. Are you currently experiencing any other symptoms?    SOB with flight of stairs, flurry of activity, walking across incline such as a parking lot.

## 2022-06-01 NOTE — Telephone Encounter (Signed)
Spoke to patient . She states she only becomes short of breath with lot activity  and  walking up a incline.   She states she recovery quickly from the activities.  No other  symptoms. She was just  giving feedback as requested .  Rn reassured patient this is what should happen.  Patient states she will continue to monitor if needed she will call back for a sooner appointment , otherwise she will keep appointment for 09/01/22 at 9:15 am

## 2022-06-19 ENCOUNTER — Other Ambulatory Visit: Payer: Self-pay | Admitting: Internal Medicine

## 2022-06-19 ENCOUNTER — Ambulatory Visit
Admission: RE | Admit: 2022-06-19 | Discharge: 2022-06-19 | Disposition: A | Discharge status: Home / Self Care | Payer: Medicare Other | Source: Ambulatory Visit | Attending: Internal Medicine | Admitting: Internal Medicine

## 2022-06-19 DIAGNOSIS — K449 Diaphragmatic hernia without obstruction or gangrene: Secondary | ICD-10-CM | POA: Diagnosis not present

## 2022-06-19 DIAGNOSIS — R918 Other nonspecific abnormal finding of lung field: Secondary | ICD-10-CM

## 2022-06-19 DIAGNOSIS — J9811 Atelectasis: Secondary | ICD-10-CM | POA: Diagnosis not present

## 2022-06-19 DIAGNOSIS — R051 Acute cough: Secondary | ICD-10-CM | POA: Diagnosis not present

## 2022-06-26 DIAGNOSIS — H35372 Puckering of macula, left eye: Secondary | ICD-10-CM | POA: Diagnosis not present

## 2022-06-26 DIAGNOSIS — H35352 Cystoid macular degeneration, left eye: Secondary | ICD-10-CM | POA: Diagnosis not present

## 2022-06-28 DIAGNOSIS — J310 Chronic rhinitis: Secondary | ICD-10-CM | POA: Diagnosis not present

## 2022-06-28 DIAGNOSIS — R052 Subacute cough: Secondary | ICD-10-CM | POA: Diagnosis not present

## 2022-07-26 DIAGNOSIS — H43811 Vitreous degeneration, right eye: Secondary | ICD-10-CM | POA: Diagnosis not present

## 2022-07-26 DIAGNOSIS — H01116 Allergic dermatitis of left eye, unspecified eyelid: Secondary | ICD-10-CM | POA: Diagnosis not present

## 2022-07-26 DIAGNOSIS — H35352 Cystoid macular degeneration, left eye: Secondary | ICD-10-CM | POA: Diagnosis not present

## 2022-07-26 DIAGNOSIS — H3342 Traction detachment of retina, left eye: Secondary | ICD-10-CM | POA: Diagnosis not present

## 2022-07-26 DIAGNOSIS — H35372 Puckering of macula, left eye: Secondary | ICD-10-CM | POA: Diagnosis not present

## 2022-08-28 DIAGNOSIS — H3342 Traction detachment of retina, left eye: Secondary | ICD-10-CM | POA: Diagnosis not present

## 2022-08-28 DIAGNOSIS — H35372 Puckering of macula, left eye: Secondary | ICD-10-CM | POA: Diagnosis not present

## 2022-08-28 DIAGNOSIS — H35352 Cystoid macular degeneration, left eye: Secondary | ICD-10-CM | POA: Diagnosis not present

## 2022-08-28 DIAGNOSIS — H43811 Vitreous degeneration, right eye: Secondary | ICD-10-CM | POA: Diagnosis not present

## 2022-08-28 DIAGNOSIS — H01116 Allergic dermatitis of left eye, unspecified eyelid: Secondary | ICD-10-CM | POA: Diagnosis not present

## 2022-08-28 NOTE — Progress Notes (Signed)
Cardiology Clinic Note   Patient Name: Emily Bates Date of Encounter: 09/01/2022  Primary Care Provider:  Thana Ates, MD Primary Cardiologist:  Emily Fair, MD  Patient Profile    82 year old female with history of paroxysmal atrial fibrillation, OSA on CPAP, anxiety, depression, and GERD.  Last seen in the office by Emily Bates on 05/07/2022.  At that time a 14-day ZIO monitor was placed due to prior history of PAF, and symptoms of shortness of breath.  CHA2DS2-VASc score was calculated at 4.  The patient was not on board adverse with anticoagulation and Watchman device was discussed with her by Emily Bates.  She did except prescription for Eliquis reluctantly.  Past Medical History    Past Medical History:  Diagnosis Date   Anxiety    Depression    Essential hypertension 05/07/2022   GERD (gastroesophageal reflux disease)    Sleep apnea    Past Surgical History:  Procedure Laterality Date   BREAST EXCISIONAL BIOPSY Right 2019   BROW LIFT Bilateral 11/11/2021   Procedure: UPPER LID BLEPHAROPLASTY;  Surgeon: Glenna Fellows, MD;  Location: [facility];  Service: Plastics;  Laterality: Bilateral;    Allergies  Allergies  Allergen Reactions   Onabotulinumtoxina Swelling   Codeine Nausea Only    Patient says she can take hydrocodone without problems.   Sulfamethoxazole-Trimethoprim Rash    History of Present Illness    Emily Bates returns for ongoing assessment and management of PAF, CAD, hyperlipidemia, hypertension, with other history to include OSA, GERD, with large hiatal hernia.  Fetal monitor dated 05/30/2022 revealed ectopic atrial tachycardia up to 19 beats with rates up to 222 bpm without atrial fibrillation.  Some of the triggered events were episodes of SVT.  There was no significant bradycardia.  She is already on metoprolol tartrate 25 mg to use as needed heart rhythm.  She comes today without any complaints of significant  palpitations.  She states that when she climbs stairs she sometimes gets winded but after she reaches the top she feels better.  She denies any heart racing, she denies any discomfort in her chest.  She has stopped taking Eliquis and metoprolol on her own as she did not feel that this was necessary based upon the results of the ZIO monitor and the lack of symptoms of palpitations.  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  Home Medications    Current Outpatient Medications  Medication Sig Dispense Refill   acetaminophen (TYLENOL) 500 MG tablet Take 1,000 mg by mouth every 8 (eight) hours as needed for mild pain or headache.      alendronate (FOSAMAX) 70 MG tablet Take 70 mg by mouth once a week. Sunday     calcium carbonate (OS-CAL) 1250 (500 Ca) MG chewable tablet Chew 2 tablets by mouth daily.      diclofenac sodium (VOLTAREN) 1 % GEL Apply 2 g topically 2 (two) times daily as needed (joint pain).      ferrous sulfate 325 (65 FE) MG tablet Take 2 tablets by mouth once a week.     fluticasone (FLONASE) 50 MCG/ACT nasal spray Place 2 sprays into both nostrils daily.     meloxicam (MOBIC) 15 MG tablet TAKE 1/2 TO 1 TABLETS BY MOUTH DAILY AS NEEDED FOR PAIN. 30 tablet 6   Multiple Vitamin (MULTIVITAMIN) capsule Take 1 capsule by mouth daily.      pantoprazole (PROTONIX) 40 MG  tablet Take 40 mg by mouth daily.      RESTASIS 0.05 % ophthalmic emulsion Place 1 drop into both eyes 2 (two) times daily.      sertraline (ZOLOFT) 100 MG tablet Take 100 mg by mouth daily.      simvastatin (ZOCOR) 10 MG tablet Take 10 mg by mouth daily at 6 PM.      tiZANidine (ZANAFLEX) 2 MG tablet Take 1-2 tablets (2-4 mg total) by mouth every 6 (six) hours as needed for muscle spasms. 60 tablet 1   vitamin B-12 (CYANOCOBALAMIN) 100 MCG tablet Take 100 mcg by mouth daily.     No current facility-administered medications for this visit.     Family History     No family history on file. has no family status information on file.   Social History    Social History   Socioeconomic History   Marital status: Married    Spouse name: Not on file   Number of children: Not on file   Years of education: Not on file   Highest education level: Not on file  Occupational History   Not on file  Tobacco Use   Smoking status: Never   Smokeless tobacco: Never  Vaping Use   Vaping status: Never Used  Substance and Sexual Activity   Alcohol use: Yes    Comment: rarely   Drug use: Never   Sexual activity: Not on file  Other Topics Concern   Not on file  Social History Narrative   Not on file   Social Determinants of Health   Financial Resource Strain: Not on file  Food Insecurity: Not on file  Transportation Needs: Not on file  Physical Activity: Not on file  Stress: Not on file  Social Connections: Not on file  Intimate Partner Violence: Not on file     Review of Systems    General:  No chills, fever, night sweats or weight changes.  Cardiovascular:  No chest pain, dyspnea on exertion, edema, orthopnea, palpitations, paroxysmal nocturnal dyspnea. Dermatological: No rash, lesions/masses Respiratory: No cough, dyspnea Urologic: No hematuria, dysuria Abdominal:   No nausea, vomiting, diarrhea, bright red blood per rectum, melena, or hematemesis Neurologic:  No visual changes, wkns, changes in mental status. All other systems reviewed and are otherwise negative except as noted above.       Physical Exam    VS:  BP 136/72   Pulse 71   Ht 5\' 3"  (1.6 m)   Wt 146 lb 12.8 oz (66.6 kg)   SpO2 96%   BMI 26.00 kg/m  , BMI Body mass index is 26 kg/m.     GEN: Well nourished, well developed, in no acute distress. HEENT: normal.  Left eye sclera induration (post retinal surgery). Neck: Supple, no JVD, carotid bruits, or masses. Cardiac: RRR, no murmurs, rubs, or gallops. No clubbing, cyanosis, edema.  Radials/DP/PT 2+ and equal  bilaterally.  Respiratory:  Respirations regular and unlabored, clear to auscultation bilaterally. GI: Soft, nontender, nondistended, BS + x 4. MS: no deformity or atrophy. Skin: warm and dry, no rash. Neuro:  Strength and sensation are intact. Psych: Normal affect.      Lab Results  Component Value Date   WBC 8.4 10/19/2019   HGB 13.0 10/19/2019   HCT 38.6 10/19/2019   MCV 90.0 10/19/2019   PLT 211 10/19/2019   Lab Results  Component Value Date   CREATININE 0.79 11/29/2021   BUN 14 11/29/2021   NA 138 11/29/2021  K 4.6 11/29/2021   CL 101 11/29/2021   CO2 27 11/29/2021   Lab Results  Component Value Date   ALT 21 10/19/2019   AST 29 10/19/2019   ALKPHOS 60 10/19/2019   BILITOT 1.0 10/19/2019   No results found for: "CHOL", "HDL", "LDLCALC", "LDLDIRECT", "TRIG", "CHOLHDL"  No results found for: "HGBA1C"   Review of Prior Studies  Zio Monitor 05/30/2022 The dominant rhythm is normal sinus rhythm with normal circadian variation.   There are occasional premature atrial contractions and frequent episodes of nonsustained atrial tachycardia (long RP mechanism, likely ectopic atrial tachycardia), up to 19 beats long and with rates up to 222 bpm. There is no atrial fibrillation.   There are rare premature ventricular beats, sometimes in a pattern of bigeminy.   The symptom triggered recordings correlate with episodes of SVT, although there are many more episodes of SVT than those that caused symptoms.   There is no significant bradycardia.   Abnormal arrhythmia monitor due to occasional premature atrial contractions (approximately 3% of beats) and episodes of nonsustained ectopic atrial tachycardia. True atrial fibrillation is not seen.      Coronary CTA 12/13/2021:   1. Large hiatal hernia with associated relaxation atelectasis in the bilateral paramedian lower lobes. 2.  Aortic Atherosclerosis (ICD10-I70.0).   1. Coronary calcium score of 0. 2. Normal coronary  origin with right dominance. 3. Noncalcified plaque in ostial RCA causes moderate (~50%) stenosis. CTFFR 0.97 across lesion in ostial RCA, suggesting lesion is not functionally significant   Echocardiogram 05/03/2022:     1. Left ventricular ejection fraction, by estimation, is 60 to 65%. The  left ventricle has normal function. The left ventricle has no regional  wall motion abnormalities. Left ventricular diastolic parameters are  consistent with Grade II diastolic  dysfunction (pseudonormalization). The average left ventricular global  longitudinal strain is -22.3 %. The global longitudinal strain is normal.   2. Right ventricular systolic function is normal. The right ventricular  size is mildly enlarged. There is normal pulmonary artery systolic  pressure. The estimated right ventricular systolic pressure is 31.3 mmHg.   3. Left atrial size was mildly dilated.   4. Right atrial size was mildly dilated.   5. The mitral valve is normal in structure. Mild mitral valve  regurgitation. No evidence of mitral stenosis.   6. The aortic valve is tricuspid. There is mild calcification of the  aortic valve. Aortic valve regurgitation is not visualized. No aortic  stenosis is present.   7. The inferior vena cava is normal in size with greater than 50%  respiratory variability, suggesting right atrial pressure of 3 mmHg.    LV e' medial:    5.03 cm/s  LV E/e' medial:  22.3  LV e' lateral:   4.22 cm/s  LV E/e' lateral: 26.5   MV Decel Time: 225 msec      MV E velocity: 112.00 cm/s  MV A velocity: 89.70 cm/s  MV E/A ratio:  1.25           RV S prime:     14.00 cm/s  TR Peak grad:   28.3 mmHg     Assessment & Plan   1.  Palpitations: The patient denies any further palpitations as she experienced in the past.  ZIO monitor did not reveal any evidence of atrial fibrillation.  The patient has stopped taking Eliquis and uses metoprolol as needed but has not been taking it.  I have reviewed  her tests concerning  coronary CTA, and echocardiogram.  She does have a history of PAF, with CHA2DS2-VASc score of 4.  Currently she chooses not to be on anticoagulation.  2.  OSA on CPAP: Compliant.  3.  Chronic diastolic dysfunction: Per echo grade 2.  Denies any evidence of volume overload, edema.  She refuses as needed diuretic at this time.  She knows to call us if she feels edema or volume overload symptoms.       Signed, Bettey Mare. Liborio Nixon, ANP, AACC   09/01/2022 10:05 AM      Office [phone] Fax [phone]  Notice: This dictation was prepared with Dragon dictation along with smaller phrase technology. Any transcriptional errors that result from this process are unintentional and may not be corrected upon review.

## 2022-08-30 DIAGNOSIS — Z8669 Personal history of other diseases of the nervous system and sense organs: Secondary | ICD-10-CM | POA: Diagnosis not present

## 2022-08-30 DIAGNOSIS — H35372 Puckering of macula, left eye: Secondary | ICD-10-CM | POA: Diagnosis not present

## 2022-08-30 DIAGNOSIS — H3342 Traction detachment of retina, left eye: Secondary | ICD-10-CM | POA: Diagnosis not present

## 2022-08-30 DIAGNOSIS — Z48810 Encounter for surgical aftercare following surgery on the sense organs: Secondary | ICD-10-CM | POA: Diagnosis not present

## 2022-09-01 ENCOUNTER — Ambulatory Visit: Payer: Medicare Other | Admitting: Adult Health

## 2022-09-01 ENCOUNTER — Encounter: Payer: Self-pay | Admitting: Adult Health

## 2022-09-01 VITALS — BP 136/72 | HR 71 | Ht 63.0 in | Wt 146.8 lb

## 2022-09-01 DIAGNOSIS — G4733 Obstructive sleep apnea (adult) (pediatric): Secondary | ICD-10-CM | POA: Insufficient documentation

## 2022-09-01 DIAGNOSIS — I480 Paroxysmal atrial fibrillation: Secondary | ICD-10-CM | POA: Diagnosis not present

## 2022-09-01 DIAGNOSIS — I5189 Other ill-defined heart diseases: Secondary | ICD-10-CM | POA: Diagnosis not present

## 2022-09-01 NOTE — Patient Instructions (Signed)
Medication Instructions:  No Changes *If you need a refill on your cardiac medications before your next appointment, please call your pharmacy*   Lab Work: No Labs If you have labs (blood work) drawn today and your tests are completely normal, you will receive your results only by: MyChart Message (if you have MyChart) OR A paper copy in the mail If you have any lab test that is abnormal or we need to change your treatment, we will call you to review the results.   Testing/Procedures: No Testing   Follow-Up: At [facility], you and your health needs are our priority.  As part of our continuing mission to provide you with exceptional heart care, we have created designated Provider Care Teams.  These Care Teams include your primary Cardiologist (physician) and Advanced Practice Providers (APPs -  Physician Assistants and Nurse Practitioners) who all work together to provide you with the care you need, when you need it.  We recommend signing up for the patient portal called "MyChart".  Sign up information is provided on this After Visit Summary.  MyChart is used to connect with patients for Virtual Visits (Telemedicine).  Patients are able to view lab/test results, encounter notes, upcoming appointments, etc.  Non-urgent messages can be sent to your provider as well.   To learn more about what you can do with MyChart, go to [email or web address].    Your next appointment:   6 month(s)  Provider:   Joni Reining, DNP, ANP   or, Thurmon Fair, MD

## 2022-09-07 DIAGNOSIS — H01116 Allergic dermatitis of left eye, unspecified eyelid: Secondary | ICD-10-CM | POA: Diagnosis not present

## 2022-09-07 DIAGNOSIS — H35372 Puckering of macula, left eye: Secondary | ICD-10-CM | POA: Diagnosis not present

## 2022-09-07 DIAGNOSIS — H43811 Vitreous degeneration, right eye: Secondary | ICD-10-CM | POA: Diagnosis not present

## 2022-09-07 DIAGNOSIS — H35352 Cystoid macular degeneration, left eye: Secondary | ICD-10-CM | POA: Diagnosis not present

## 2022-09-07 DIAGNOSIS — H3342 Traction detachment of retina, left eye: Secondary | ICD-10-CM | POA: Diagnosis not present

## 2022-09-27 DIAGNOSIS — H43811 Vitreous degeneration, right eye: Secondary | ICD-10-CM | POA: Diagnosis not present

## 2022-09-27 DIAGNOSIS — H35352 Cystoid macular degeneration, left eye: Secondary | ICD-10-CM | POA: Diagnosis not present

## 2022-09-27 DIAGNOSIS — H33022 Retinal detachment with multiple breaks, left eye: Secondary | ICD-10-CM | POA: Diagnosis not present

## 2022-09-27 DIAGNOSIS — H35372 Puckering of macula, left eye: Secondary | ICD-10-CM | POA: Diagnosis not present

## 2022-09-27 DIAGNOSIS — H3342 Traction detachment of retina, left eye: Secondary | ICD-10-CM | POA: Diagnosis not present

## 2022-09-27 DIAGNOSIS — H01116 Allergic dermatitis of left eye, unspecified eyelid: Secondary | ICD-10-CM | POA: Diagnosis not present

## 2022-09-29 DIAGNOSIS — H3589 Other specified retinal disorders: Secondary | ICD-10-CM | POA: Diagnosis not present

## 2022-09-29 DIAGNOSIS — H3342 Traction detachment of retina, left eye: Secondary | ICD-10-CM | POA: Diagnosis not present

## 2022-09-29 DIAGNOSIS — H33022 Retinal detachment with multiple breaks, left eye: Secondary | ICD-10-CM | POA: Diagnosis not present

## 2022-10-05 DIAGNOSIS — L820 Inflamed seborrheic keratosis: Secondary | ICD-10-CM | POA: Diagnosis not present

## 2022-10-05 DIAGNOSIS — H01116 Allergic dermatitis of left eye, unspecified eyelid: Secondary | ICD-10-CM | POA: Diagnosis not present

## 2022-10-05 DIAGNOSIS — L718 Other rosacea: Secondary | ICD-10-CM | POA: Diagnosis not present

## 2022-10-05 DIAGNOSIS — L538 Other specified erythematous conditions: Secondary | ICD-10-CM | POA: Diagnosis not present

## 2022-10-05 DIAGNOSIS — H43811 Vitreous degeneration, right eye: Secondary | ICD-10-CM | POA: Diagnosis not present

## 2022-10-05 DIAGNOSIS — H35352 Cystoid macular degeneration, left eye: Secondary | ICD-10-CM | POA: Diagnosis not present

## 2022-10-05 DIAGNOSIS — R208 Other disturbances of skin sensation: Secondary | ICD-10-CM | POA: Diagnosis not present

## 2022-10-05 DIAGNOSIS — Z7189 Other specified counseling: Secondary | ICD-10-CM | POA: Diagnosis not present

## 2022-10-05 DIAGNOSIS — H35372 Puckering of macula, left eye: Secondary | ICD-10-CM | POA: Diagnosis not present

## 2022-10-05 DIAGNOSIS — H3342 Traction detachment of retina, left eye: Secondary | ICD-10-CM | POA: Diagnosis not present

## 2022-10-05 DIAGNOSIS — H33022 Retinal detachment with multiple breaks, left eye: Secondary | ICD-10-CM | POA: Diagnosis not present

## 2022-10-18 DIAGNOSIS — F331 Major depressive disorder, recurrent, moderate: Secondary | ICD-10-CM | POA: Diagnosis not present

## 2022-10-18 DIAGNOSIS — E559 Vitamin D deficiency, unspecified: Secondary | ICD-10-CM | POA: Diagnosis not present

## 2022-10-18 DIAGNOSIS — E7800 Pure hypercholesterolemia, unspecified: Secondary | ICD-10-CM | POA: Diagnosis not present

## 2022-10-18 DIAGNOSIS — D509 Iron deficiency anemia, unspecified: Secondary | ICD-10-CM | POA: Diagnosis not present

## 2022-10-18 DIAGNOSIS — R5383 Other fatigue: Secondary | ICD-10-CM | POA: Diagnosis not present

## 2022-10-19 DIAGNOSIS — H3342 Traction detachment of retina, left eye: Secondary | ICD-10-CM | POA: Diagnosis not present

## 2022-10-19 DIAGNOSIS — H538 Other visual disturbances: Secondary | ICD-10-CM | POA: Diagnosis not present

## 2022-10-19 DIAGNOSIS — H524 Presbyopia: Secondary | ICD-10-CM | POA: Diagnosis not present

## 2022-10-19 DIAGNOSIS — H35352 Cystoid macular degeneration, left eye: Secondary | ICD-10-CM | POA: Diagnosis not present

## 2022-10-19 DIAGNOSIS — H35372 Puckering of macula, left eye: Secondary | ICD-10-CM | POA: Diagnosis not present

## 2022-10-25 DIAGNOSIS — D509 Iron deficiency anemia, unspecified: Secondary | ICD-10-CM | POA: Diagnosis not present

## 2022-11-02 DIAGNOSIS — H3342 Traction detachment of retina, left eye: Secondary | ICD-10-CM | POA: Diagnosis not present

## 2022-11-02 DIAGNOSIS — H33022 Retinal detachment with multiple breaks, left eye: Secondary | ICD-10-CM | POA: Diagnosis not present

## 2022-11-02 DIAGNOSIS — H01116 Allergic dermatitis of left eye, unspecified eyelid: Secondary | ICD-10-CM | POA: Diagnosis not present

## 2022-11-02 DIAGNOSIS — H43811 Vitreous degeneration, right eye: Secondary | ICD-10-CM | POA: Diagnosis not present

## 2022-11-02 DIAGNOSIS — H35372 Puckering of macula, left eye: Secondary | ICD-10-CM | POA: Diagnosis not present

## 2022-11-02 DIAGNOSIS — H35352 Cystoid macular degeneration, left eye: Secondary | ICD-10-CM | POA: Diagnosis not present

## 2022-11-10 DIAGNOSIS — D509 Iron deficiency anemia, unspecified: Secondary | ICD-10-CM | POA: Diagnosis not present

## 2022-11-10 DIAGNOSIS — M810 Age-related osteoporosis without current pathological fracture: Secondary | ICD-10-CM | POA: Diagnosis not present

## 2022-11-10 DIAGNOSIS — K219 Gastro-esophageal reflux disease without esophagitis: Secondary | ICD-10-CM | POA: Diagnosis not present

## 2022-11-10 DIAGNOSIS — G4730 Sleep apnea, unspecified: Secondary | ICD-10-CM | POA: Diagnosis not present

## 2022-11-10 DIAGNOSIS — Z1331 Encounter for screening for depression: Secondary | ICD-10-CM | POA: Diagnosis not present

## 2022-11-10 DIAGNOSIS — I471 Supraventricular tachycardia, unspecified: Secondary | ICD-10-CM | POA: Diagnosis not present

## 2022-11-10 DIAGNOSIS — Z0000 Encounter for general adult medical examination without abnormal findings: Secondary | ICD-10-CM | POA: Diagnosis not present

## 2022-11-10 DIAGNOSIS — Z79899 Other long term (current) drug therapy: Secondary | ICD-10-CM | POA: Diagnosis not present

## 2022-11-10 DIAGNOSIS — I700 Atherosclerosis of aorta: Secondary | ICD-10-CM | POA: Diagnosis not present

## 2022-11-10 DIAGNOSIS — E781 Pure hyperglyceridemia: Secondary | ICD-10-CM | POA: Diagnosis not present

## 2022-11-10 DIAGNOSIS — Z23 Encounter for immunization: Secondary | ICD-10-CM | POA: Diagnosis not present

## 2022-11-10 DIAGNOSIS — E7800 Pure hypercholesterolemia, unspecified: Secondary | ICD-10-CM | POA: Diagnosis not present

## 2022-11-21 DIAGNOSIS — Z23 Encounter for immunization: Secondary | ICD-10-CM | POA: Diagnosis not present

## 2022-11-30 DIAGNOSIS — H04563 Stenosis of bilateral lacrimal punctum: Secondary | ICD-10-CM | POA: Diagnosis not present

## 2022-11-30 DIAGNOSIS — H04123 Dry eye syndrome of bilateral lacrimal glands: Secondary | ICD-10-CM | POA: Diagnosis not present

## 2022-11-30 DIAGNOSIS — H538 Other visual disturbances: Secondary | ICD-10-CM | POA: Diagnosis not present

## 2023-01-04 DIAGNOSIS — H3342 Traction detachment of retina, left eye: Secondary | ICD-10-CM | POA: Diagnosis not present

## 2023-01-04 DIAGNOSIS — H35352 Cystoid macular degeneration, left eye: Secondary | ICD-10-CM | POA: Diagnosis not present

## 2023-01-04 DIAGNOSIS — H35372 Puckering of macula, left eye: Secondary | ICD-10-CM | POA: Diagnosis not present

## 2023-01-04 DIAGNOSIS — H01116 Allergic dermatitis of left eye, unspecified eyelid: Secondary | ICD-10-CM | POA: Diagnosis not present

## 2023-01-04 DIAGNOSIS — H43811 Vitreous degeneration, right eye: Secondary | ICD-10-CM | POA: Diagnosis not present

## 2023-01-04 DIAGNOSIS — H33022 Retinal detachment with multiple breaks, left eye: Secondary | ICD-10-CM | POA: Diagnosis not present

## 2023-01-08 ENCOUNTER — Ambulatory Visit: Payer: Medicare Other | Admitting: Physician Assistant

## 2023-01-08 ENCOUNTER — Encounter: Payer: Self-pay | Admitting: Physician Assistant

## 2023-01-08 ENCOUNTER — Other Ambulatory Visit (INDEPENDENT_AMBULATORY_CARE_PROVIDER_SITE_OTHER): Payer: Self-pay

## 2023-01-08 DIAGNOSIS — G8929 Other chronic pain: Secondary | ICD-10-CM | POA: Diagnosis not present

## 2023-01-08 DIAGNOSIS — M25561 Pain in right knee: Secondary | ICD-10-CM | POA: Diagnosis not present

## 2023-01-08 NOTE — Progress Notes (Signed)
Office Visit Note   Patient: Emily Bates           Date of Birth: Jan 03, 1941           MRN: [id number] Visit Date: 01/08/2023              Requested by: Thana Ates, MD [address] [place],  [place] [address] PCP: Thana Ates, MD   Assessment & Plan: Visit Diagnoses:  1. Chronic pain of right knee     Plan: X-rays demonstrate CPPD.  I think this is more likely the root of her issues.  We talked about the natural history of this she also does have some arthritis.  I offered her an injection which she declined today.  She does wear knee support and use meloxicam.  I have talked her about a slightly more supportive knee sleeve she could get somewhere at one of the medical supply store or order on [employer].  If she wants an injection at any time she may follow-up with me  Follow-Up Instructions: Return if symptoms worsen or fail to improve.   Orders:  Orders Placed This Encounter  Procedures   XR KNEE 3 VIEW RIGHT   No orders of the defined types were placed in this encounter.     Procedures: No procedures performed   Clinical Data: No additional findings.   Subjective: Chief Complaint  Patient presents with   Right Knee - Pain    HPI patient is a pleasant 82 year old woman with a history of right knee pain times years.  She thinks this may have been secondary to a ski accident many years ago.  She does acknowledge a fall 2 years ago.  She also had a twisting injury to it when working in the yard about 5 weeks ago.  Most the pain is on the medial side of her knee.  Pain is not consistent.  She has a lot of stiffness when she first gets up she takes meloxicam as needed  Review of Systems  All other systems reviewed and are negative.    Objective: Vital Signs: There were no vitals taken for this visit.  Physical Exam Constitutional:      Appearance: Normal appearance.  Pulmonary:     Effort: Pulmonary effort is normal.  Skin:     General: Skin is warm and dry.  Neurological:     General: No focal deficit present.     Mental Status: She is alert and oriented to person, place, and time.  Psychiatric:        Mood and Affect: Mood normal.     Ortho Exam Right knee she is neurovascular intact compartments are soft and compressible she has no erythema no effusion she has some tenderness medially more than laterally.  Good varus valgus stability Specialty Comments:  No specialty comments available.  Imaging: XR KNEE 3 VIEW RIGHT Result Date: 01/08/2023 Radiographs of her right knee were reviewed today.  She does have some degenerative and sclerotic changes.  She also has CPPD in both knees    PMFS History: Patient Active Problem List   Diagnosis Date Noted   Pain in right knee 01/08/2023   Paroxysmal atrial fibrillation (HCC) 05/07/2022   Atherosclerosis of aorta (HCC) 05/07/2022   Hypercholesterolemia 05/07/2022   Essential hypertension 05/07/2022   OSA (obstructive sleep apnea) 05/07/2022   Gastroesophageal reflux disease 05/07/2022   Chronic diastolic heart failure (HCC) 05/07/2022   Past Medical History:  Diagnosis  Date   Anxiety    Depression    Essential hypertension 05/07/2022   GERD (gastroesophageal reflux disease)    Sleep apnea     No family history on file.  Past Surgical History:  Procedure Laterality Date   BREAST EXCISIONAL BIOPSY Right 2019   BROW LIFT Bilateral 11/11/2021   Procedure: UPPER LID BLEPHAROPLASTY;  Surgeon: Glenna Fellows, MD;  Location: [facility];  Service: Plastics;  Laterality: Bilateral;   Social History   Occupational History   Not on file  Tobacco Use   Smoking status: Never   Smokeless tobacco: Never  Vaping Use   Vaping status: Never Used  Substance and Sexual Activity   Alcohol use: Yes    Comment: rarely   Drug use: Never   Sexual activity: Not on file

## 2023-01-16 ENCOUNTER — Ambulatory Visit: Payer: Medicare Other | Admitting: Physician Assistant

## 2023-02-14 ENCOUNTER — Ambulatory Visit (INDEPENDENT_AMBULATORY_CARE_PROVIDER_SITE_OTHER): Payer: Medicare Other | Admitting: Orthopedic Surgery

## 2023-02-14 ENCOUNTER — Encounter: Payer: Self-pay | Admitting: Orthopedic Surgery

## 2023-02-14 DIAGNOSIS — M1711 Unilateral primary osteoarthritis, right knee: Secondary | ICD-10-CM

## 2023-02-14 NOTE — Progress Notes (Signed)
 Office Visit Note   Patient: Emily Bates           Date of Birth: 04-11-1940           MRN: [id number] Visit Date: 02/14/2023 Requested by: Thana Ates, MD [address] [place],  [place] [address] PCP: Thana Ates, MD  Subjective: Chief Complaint  Patient presents with   Right Knee - Pain    HPI: Emily Bates is a 83 y.o. female who presents to the office reporting right knee pain.  Patient reports lengthy history of right knee pain.  Initially started years ago when she felt a pop while skiing and required 50 cc of blood to be aspirated from her knee.  This eventually calm down but did start the chain of intermittent pain in her right knee.  Patient fell off of a chair 4 to 5 years ago and most recently about 2 months ago she was walking and felt her right knee buckle with a pulling sensation but no discrete pop.  She describes medial sided knee pain that is worse with pressure or palpation.  She takes Mobic and Tylenol with intermittent relief.  No left leg symptoms.  She will have occasional burning and stinging sensation on the medial knee that will radiate to the medial shin.  Sometimes she will feel a heaviness sensation.  She denies any groin pain.  She does have chronic low back pain that is no worse than normal.  No history of prior back or knee surgery.  2 nights ago she was having significant fairly constant pain that was keeping her up at night but that has calm down quite a bit recently.  She enjoys gardening and has a little bit of soreness with kneeling on her knee but she is able to do that without much difficulty.  She does note stiffness with immobility and has to give herself some time before starting to walk after she gets up from a seated position..                ROS: All systems reviewed are negative as they relate to the chief complaint within the history of present illness.  Patient denies fevers or chills.  Assessment & Plan: Visit  Diagnoses:  1. Arthritis of right knee     Plan: Patient is an 83 year old female who presents for evaluation of right knee pain.  She has primarily medial sided pain that does correlate with her recent radiographs.  We reviewed her radiographs today that demonstrate chondrocalcinosis and medial joint space narrowing.  This may explain the periodic severe flareups that she has as far as pointing towards CPPD.  She also has arthritis which likely accounts for a lot of the medial knee pain that she has in her daily life.  However the constant burning/stinging sensation that she had 2 nights ago may be more back related though this has now calm down.  Best way to determine this would be to try a diagnostic injection into the right knee.  We discussed other options for relief of her knee pain including cortisone versus gel injection versus exercise program with stationary bike and nonload bearing quadricep strengthening exercises.  She would like to hold off on any intervention today but she will let us know if she would like to proceed with knee injection if this pain flares up again.  Follow-up with the office as needed.  Follow-Up Instructions: No follow-ups on file.  Orders:  No orders of the defined types were placed in this encounter.  No orders of the defined types were placed in this encounter.     Procedures: No procedures performed   Clinical Data: No additional findings.  Objective: Vital Signs: There were no vitals taken for this visit.  Physical Exam:  Constitutional: Patient appears well-developed HEENT:  Head: Normocephalic Eyes:EOM are normal Neck: Normal range of motion Cardiovascular: Normal rate Pulmonary/chest: Effort normal Neurologic: Patient is alert Skin: Skin is warm Psychiatric: Patient has normal mood and affect  Ortho Exam: Ortho exam demonstrates right knee with moderate effusion compared with trace effusion in the left knee.  She has range of motion to  about 1 to 2 degrees of extension and excellent knee flexion more than 120 degrees.  No calf tenderness.  Negative Homans' sign.  Palpable DP pulse of the right lower extremity.  Intact ankle dorsiflexion and plantarflexion.  Excellent quad strength rated 5/5.  She has moderate tenderness over the medial joint line and no tenderness over the lateral joint line.  Stable to anterior and posterior drawer sign.  No pain with hip range of motion.  Able to perform straight leg raise without extensor lag.  Specialty Comments:  No specialty comments available.  Imaging: No results found.   PMFS History: Patient Active Problem List   Diagnosis Date Noted   Pain in right knee 01/08/2023   Paroxysmal atrial fibrillation (HCC) 05/07/2022   Atherosclerosis of aorta (HCC) 05/07/2022   Hypercholesterolemia 05/07/2022   Essential hypertension 05/07/2022   OSA (obstructive sleep apnea) 05/07/2022   Gastroesophageal reflux disease 05/07/2022   Chronic diastolic heart failure (HCC) 05/07/2022   Past Medical History:  Diagnosis Date   Anxiety    Depression    Essential hypertension 05/07/2022   GERD (gastroesophageal reflux disease)    Sleep apnea     No family history on file.  Past Surgical History:  Procedure Laterality Date   BREAST EXCISIONAL BIOPSY Right 2019   BROW LIFT Bilateral 11/11/2021   Procedure: UPPER LID BLEPHAROPLASTY;  Surgeon: Glenna Fellows, MD;  Location: [facility];  Service: Plastics;  Laterality: Bilateral;   Social History   Occupational History   Not on file  Tobacco Use   Smoking status: Never   Smokeless tobacco: Never  Vaping Use   Vaping status: Never Used  Substance and Sexual Activity   Alcohol use: Yes    Comment: rarely   Drug use: Never   Sexual activity: Not on file

## 2023-04-16 DIAGNOSIS — G4733 Obstructive sleep apnea (adult) (pediatric): Secondary | ICD-10-CM | POA: Diagnosis not present

## 2023-04-16 DIAGNOSIS — G4700 Insomnia, unspecified: Secondary | ICD-10-CM | POA: Diagnosis not present

## 2023-04-30 DIAGNOSIS — H18812 Anesthesia and hypoesthesia of cornea, left eye: Secondary | ICD-10-CM | POA: Diagnosis not present

## 2023-04-30 DIAGNOSIS — H16232 Neurotrophic keratoconjunctivitis, left eye: Secondary | ICD-10-CM | POA: Diagnosis not present

## 2023-04-30 DIAGNOSIS — H04563 Stenosis of bilateral lacrimal punctum: Secondary | ICD-10-CM | POA: Diagnosis not present

## 2023-04-30 DIAGNOSIS — H04123 Dry eye syndrome of bilateral lacrimal glands: Secondary | ICD-10-CM | POA: Diagnosis not present

## 2023-07-03 DIAGNOSIS — H33022 Retinal detachment with multiple breaks, left eye: Secondary | ICD-10-CM | POA: Diagnosis not present

## 2023-07-03 DIAGNOSIS — H3342 Traction detachment of retina, left eye: Secondary | ICD-10-CM | POA: Diagnosis not present

## 2023-07-03 DIAGNOSIS — H01116 Allergic dermatitis of left eye, unspecified eyelid: Secondary | ICD-10-CM | POA: Diagnosis not present

## 2023-07-03 DIAGNOSIS — H35372 Puckering of macula, left eye: Secondary | ICD-10-CM | POA: Diagnosis not present

## 2023-07-03 DIAGNOSIS — H43811 Vitreous degeneration, right eye: Secondary | ICD-10-CM | POA: Diagnosis not present

## 2023-07-03 DIAGNOSIS — H35352 Cystoid macular degeneration, left eye: Secondary | ICD-10-CM | POA: Diagnosis not present

## 2023-07-10 ENCOUNTER — Other Ambulatory Visit (HOSPITAL_BASED_OUTPATIENT_CLINIC_OR_DEPARTMENT_OTHER): Payer: Self-pay | Admitting: Internal Medicine

## 2023-07-10 DIAGNOSIS — M810 Age-related osteoporosis without current pathological fracture: Secondary | ICD-10-CM

## 2023-07-25 DIAGNOSIS — H16232 Neurotrophic keratoconjunctivitis, left eye: Secondary | ICD-10-CM | POA: Diagnosis not present

## 2023-07-25 DIAGNOSIS — H04123 Dry eye syndrome of bilateral lacrimal glands: Secondary | ICD-10-CM | POA: Diagnosis not present

## 2023-07-25 DIAGNOSIS — H18812 Anesthesia and hypoesthesia of cornea, left eye: Secondary | ICD-10-CM | POA: Diagnosis not present

## 2023-09-11 NOTE — Progress Notes (Signed)
 Signature Facial Products Used: Double cleanse Elta Oil and foaming Manual exfoliation during second cleanse Obagi toner Elta skin recovery toner Skinmedica calming mask Botanical lotion with lavender  Obagi toner High frequency Elta skin recovery serum Skinmedica tns cream No SPF

## 2023-09-24 ENCOUNTER — Other Ambulatory Visit: Payer: Self-pay

## 2023-09-24 ENCOUNTER — Encounter: Payer: Self-pay | Admitting: Physician Assistant

## 2023-09-24 ENCOUNTER — Ambulatory Visit (INDEPENDENT_AMBULATORY_CARE_PROVIDER_SITE_OTHER): Admitting: Physician Assistant

## 2023-09-24 DIAGNOSIS — M1712 Unilateral primary osteoarthritis, left knee: Secondary | ICD-10-CM

## 2023-09-24 DIAGNOSIS — M25562 Pain in left knee: Secondary | ICD-10-CM

## 2023-09-24 MED ORDER — METHYLPREDNISOLONE ACETATE 40 MG/ML IJ SUSP
40.0000 mg | INTRAMUSCULAR | Status: AC | PRN
Start: 2023-09-24 — End: 2023-09-24
  Administered 2023-09-24: 40 mg via INTRA_ARTICULAR

## 2023-09-24 MED ORDER — LIDOCAINE HCL (PF) 1 % IJ SOLN
5.0000 mL | INTRAMUSCULAR | Status: AC | PRN
Start: 2023-09-24 — End: 2023-09-24
  Administered 2023-09-24: 5 mL

## 2023-09-24 NOTE — Progress Notes (Signed)
 Office Visit Note   Patient: Emily Bates           Date of Birth: 07/04/1940           MRN: [id number] Visit Date: 09/24/2023              Requested by: Dwight Trula SQUIBB, MD [address] [place],  [place] [address] PCP: Dwight Trula SQUIBB, MD  Chief Complaint  Patient presents with   Left Knee - Pain      HPI: 83 y/o female with left sudden knees pain over the past 3 weeks.  She states she has sudden sharp left knee pain with sitting, standing and wakes her from sleep.  She denies trauma or recent surgery.  She has a history of OA and takes PRN Mobic .     Assessment & Plan: Visit Diagnoses:  1. Acute pain of left knee     Plan: Activity as tolerates.  Votaren gel PRN   Follow-Up Instructions: Return if symptoms worsen or fail to improve.   Ortho Exam  Patient is alert, oriented, no adenopathy, well-dressed, normal affect, normal respiratory effort. No effusion, or edema.  No cellulitis.  NTTP along the joint line medial and lateral.  Minimal crepitus of the patella with active ROM.      Imaging: Left lateral joint line narrowing no subchondral cyst  Right medial joint line narrowing.    Labs: No results found for: HGBA1C, ESRSEDRATE, CRP, LABURIC, REPTSTATUS, GRAMSTAIN, CULT, LABORGA   Lab Results  Component Value Date   ALBUMIN 4.3 10/19/2019    No results found for: MG No results found for: VD25OH  No results found for: PREALBUMIN    Latest Ref Rng & Units 10/19/2019   12:45 PM  CBC EXTENDED  WBC 4.0 - 10.5 K/uL 8.4   RBC 3.87 - 5.11 MIL/uL 4.29   Hemoglobin 12.0 - 15.0 g/dL 86.9   HCT 63.9 - 53.9 % 38.6   Platelets 150 - 400 K/uL 211   NEUT# 1.7 - 7.7 K/uL 7.1   Lymph# 0.7 - 4.0 K/uL 0.8      There is no height or weight on file to calculate BMI.  Orders:  Orders Placed This Encounter  Procedures   Large Joint Inj   XR Knee 1-2 Views Left   No orders of the defined types were placed in this  encounter.    Procedures: Large Joint Inj: R knee on 09/24/2023 10:18 AM Indications: pain and diagnostic evaluation Details: 22 G 1.5 in needle  Arthrogram: No  Medications: 5 mL lidocaine  (PF) 1 %; 40 mg methylPREDNISolone  acetate 40 MG/ML Outcome: tolerated well, no immediate complications Procedure, treatment alternatives, risks and benefits explained, specific risks discussed. Consent was given by the patient. Immediately prior to procedure a time out was called to verify the correct patient, procedure, equipment, support staff and site/side marked as required. Patient was prepped and draped in the usual sterile fashion.      Clinical Data: No additional findings.  ROS:  All other systems negative, except as noted in the HPI. Review of Systems  Objective: Vital Signs: There were no vitals taken for this visit.  Specialty Comments:  No specialty comments available.  PMFS History: Patient Active Problem List   Diagnosis Date Noted   Pain in right knee 01/08/2023   Paroxysmal atrial fibrillation (HCC) 05/07/2022   Atherosclerosis of aorta (HCC) 05/07/2022   Hypercholesterolemia 05/07/2022   Essential hypertension 05/07/2022   OSA (  obstructive sleep apnea) 05/07/2022   Gastroesophageal reflux disease 05/07/2022   Chronic diastolic heart failure (HCC) 05/07/2022   Past Medical History:  Diagnosis Date   Anxiety    Depression    Essential hypertension 05/07/2022   GERD (gastroesophageal reflux disease)    Sleep apnea     History reviewed. No pertinent family history.  Past Surgical History:  Procedure Laterality Date   BREAST EXCISIONAL BIOPSY Right 2019   BROW LIFT Bilateral 11/11/2021   Procedure: UPPER LID BLEPHAROPLASTY;  Surgeon: Arelia Filippo, MD;  Location: [facility];  Service: Plastics;  Laterality: Bilateral;   Social History   Occupational History   Not on file  Tobacco Use   Smoking status: Never   Smokeless tobacco: Never   Vaping Use   Vaping status: Never Used  Substance and Sexual Activity   Alcohol use: Yes    Comment: rarely   Drug use: Never   Sexual activity: Not on file

## 2023-09-25 ENCOUNTER — Ambulatory Visit: Admitting: Physician Assistant

## 2023-10-25 DIAGNOSIS — H04123 Dry eye syndrome of bilateral lacrimal glands: Secondary | ICD-10-CM | POA: Diagnosis not present

## 2023-10-25 DIAGNOSIS — H18812 Anesthesia and hypoesthesia of cornea, left eye: Secondary | ICD-10-CM | POA: Diagnosis not present

## 2023-10-25 DIAGNOSIS — H16232 Neurotrophic keratoconjunctivitis, left eye: Secondary | ICD-10-CM | POA: Diagnosis not present

## 2023-10-25 DIAGNOSIS — H04563 Stenosis of bilateral lacrimal punctum: Secondary | ICD-10-CM | POA: Diagnosis not present

## 2023-10-25 DIAGNOSIS — H35352 Cystoid macular degeneration, left eye: Secondary | ICD-10-CM | POA: Diagnosis not present

## 2023-11-19 ENCOUNTER — Encounter: Payer: Self-pay | Admitting: Radiology

## 2023-11-19 DIAGNOSIS — K219 Gastro-esophageal reflux disease without esophagitis: Secondary | ICD-10-CM | POA: Diagnosis not present

## 2023-11-19 DIAGNOSIS — Z0000 Encounter for general adult medical examination without abnormal findings: Secondary | ICD-10-CM | POA: Diagnosis not present

## 2023-11-19 DIAGNOSIS — M810 Age-related osteoporosis without current pathological fracture: Secondary | ICD-10-CM | POA: Diagnosis not present

## 2023-11-19 DIAGNOSIS — D509 Iron deficiency anemia, unspecified: Secondary | ICD-10-CM | POA: Diagnosis not present

## 2023-11-19 DIAGNOSIS — Z1331 Encounter for screening for depression: Secondary | ICD-10-CM | POA: Diagnosis not present

## 2023-11-19 DIAGNOSIS — E7800 Pure hypercholesterolemia, unspecified: Secondary | ICD-10-CM | POA: Diagnosis not present

## 2023-11-19 DIAGNOSIS — E663 Overweight: Secondary | ICD-10-CM | POA: Diagnosis not present

## 2023-11-19 DIAGNOSIS — Z79899 Other long term (current) drug therapy: Secondary | ICD-10-CM | POA: Diagnosis not present

## 2023-11-19 DIAGNOSIS — G4730 Sleep apnea, unspecified: Secondary | ICD-10-CM | POA: Diagnosis not present

## 2023-11-19 DIAGNOSIS — I471 Supraventricular tachycardia, unspecified: Secondary | ICD-10-CM | POA: Diagnosis not present

## 2023-11-19 DIAGNOSIS — Z23 Encounter for immunization: Secondary | ICD-10-CM | POA: Diagnosis not present

## 2023-12-06 DIAGNOSIS — R748 Abnormal levels of other serum enzymes: Secondary | ICD-10-CM | POA: Diagnosis not present
